# Patient Record
Sex: Female | Born: 1965
Health system: Southern US, Community
[De-identification: ages and names within clinical notes are randomized; demographics above are authoritative.]

## PROBLEM LIST (undated history)

## (undated) DIAGNOSIS — M2540 Effusion, unspecified joint: Secondary | ICD-10-CM

## (undated) DIAGNOSIS — E785 Hyperlipidemia, unspecified: Secondary | ICD-10-CM

## (undated) DIAGNOSIS — R51 Headache: Secondary | ICD-10-CM

## (undated) HISTORY — PX: APPENDECTOMY: SHX54

## (undated) HISTORY — PX: DILATION AND CURETTAGE OF UTERUS: SHX78

## (undated) HISTORY — PX: CHOLECYSTECTOMY: SHX55

## (undated) HISTORY — PX: MOUTH SURGERY: SHX715

---

## 2005-04-17 ENCOUNTER — Other Ambulatory Visit: Admission: RE | Admit: 2005-04-17 | Discharge: 2005-04-17 | Payer: Self-pay | Admitting: Internal Medicine

## 2005-05-01 ENCOUNTER — Encounter: Admission: RE | Admit: 2005-05-01 | Discharge: 2005-05-01 | Payer: Self-pay | Admitting: Internal Medicine

## 2005-08-25 ENCOUNTER — Ambulatory Visit (HOSPITAL_COMMUNITY): Admission: RE | Admit: 2005-08-25 | Discharge: 2005-08-25 | Payer: Self-pay | Admitting: Obstetrics and Gynecology

## 2005-08-25 ENCOUNTER — Encounter (INDEPENDENT_AMBULATORY_CARE_PROVIDER_SITE_OTHER): Payer: Self-pay | Admitting: *Deleted

## 2005-08-27 ENCOUNTER — Inpatient Hospital Stay (HOSPITAL_COMMUNITY): Admission: EM | Admit: 2005-08-27 | Discharge: 2005-09-01 | Payer: Self-pay | Admitting: Emergency Medicine

## 2005-08-28 ENCOUNTER — Encounter (INDEPENDENT_AMBULATORY_CARE_PROVIDER_SITE_OTHER): Payer: Self-pay | Admitting: Specialist

## 2005-09-06 ENCOUNTER — Emergency Department (HOSPITAL_COMMUNITY): Admission: EM | Admit: 2005-09-06 | Discharge: 2005-09-06 | Payer: Self-pay | Admitting: Emergency Medicine

## 2006-05-03 ENCOUNTER — Encounter: Admission: RE | Admit: 2006-05-03 | Discharge: 2006-05-03 | Payer: Self-pay | Admitting: Internal Medicine

## 2006-05-24 ENCOUNTER — Other Ambulatory Visit: Admission: RE | Admit: 2006-05-24 | Discharge: 2006-05-24 | Payer: Self-pay | Admitting: Obstetrics and Gynecology

## 2007-03-02 ENCOUNTER — Encounter: Admission: RE | Admit: 2007-03-02 | Discharge: 2007-03-02 | Payer: Self-pay | Admitting: Internal Medicine

## 2007-05-10 ENCOUNTER — Encounter: Admission: RE | Admit: 2007-05-10 | Discharge: 2007-05-10 | Payer: Self-pay | Admitting: Internal Medicine

## 2007-05-25 ENCOUNTER — Other Ambulatory Visit: Admission: RE | Admit: 2007-05-25 | Discharge: 2007-05-25 | Payer: Self-pay | Admitting: Obstetrics and Gynecology

## 2007-11-28 ENCOUNTER — Encounter: Admission: RE | Admit: 2007-11-28 | Discharge: 2007-11-28 | Payer: Self-pay | Admitting: Internal Medicine

## 2008-10-02 ENCOUNTER — Other Ambulatory Visit: Admission: RE | Admit: 2008-10-02 | Discharge: 2008-10-02 | Payer: Self-pay | Admitting: Obstetrics and Gynecology

## 2009-08-26 ENCOUNTER — Other Ambulatory Visit: Admission: RE | Admit: 2009-08-26 | Discharge: 2009-08-26 | Payer: Self-pay | Admitting: Obstetrics and Gynecology

## 2009-09-10 ENCOUNTER — Encounter: Admission: RE | Admit: 2009-09-10 | Discharge: 2009-09-10 | Payer: Self-pay | Admitting: Internal Medicine

## 2010-04-30 ENCOUNTER — Emergency Department (HOSPITAL_COMMUNITY)
Admission: EM | Admit: 2010-04-30 | Discharge: 2010-04-30 | Payer: Self-pay | Source: Home / Self Care | Admitting: Emergency Medicine

## 2010-04-30 LAB — POCT I-STAT, CHEM 8
BUN: 8 mg/dL (ref 6–23)
Creatinine, Ser: 1.1 mg/dL (ref 0.4–1.2)
Glucose, Bld: 133 mg/dL — ABNORMAL HIGH (ref 70–99)
Hemoglobin: 12.6 g/dL (ref 12.0–15.0)
Potassium: 3.8 mEq/L (ref 3.5–5.1)

## 2010-06-12 ENCOUNTER — Other Ambulatory Visit: Payer: Self-pay | Admitting: Internal Medicine

## 2010-06-12 ENCOUNTER — Ambulatory Visit
Admission: RE | Admit: 2010-06-12 | Discharge: 2010-06-12 | Disposition: A | Discharge status: Home / Self Care | Payer: 59 | Source: Ambulatory Visit | Attending: Internal Medicine | Admitting: Internal Medicine

## 2010-06-12 DIAGNOSIS — R0600 Dyspnea, unspecified: Secondary | ICD-10-CM

## 2010-06-12 DIAGNOSIS — R0781 Pleurodynia: Secondary | ICD-10-CM

## 2010-06-12 MED ORDER — IOHEXOL 300 MG/ML  SOLN
100.0000 mL | Freq: Once | INTRAMUSCULAR | Status: AC | PRN
  Administered 2010-06-12: 100 mL via INTRAVENOUS

## 2010-06-19 ENCOUNTER — Emergency Department (HOSPITAL_COMMUNITY)
Admission: EM | Admit: 2010-06-19 | Discharge: 2010-06-19 | Disposition: A | Discharge status: Home / Self Care | Payer: 59 | Attending: Emergency Medicine | Admitting: Emergency Medicine

## 2010-06-19 ENCOUNTER — Emergency Department (HOSPITAL_COMMUNITY): Payer: 59

## 2010-06-19 DIAGNOSIS — Y92009 Unspecified place in unspecified non-institutional (private) residence as the place of occurrence of the external cause: Secondary | ICD-10-CM | POA: Insufficient documentation

## 2010-06-19 DIAGNOSIS — S92919A Unspecified fracture of unspecified toe(s), initial encounter for closed fracture: Secondary | ICD-10-CM | POA: Insufficient documentation

## 2010-06-19 DIAGNOSIS — W2203XA Walked into furniture, initial encounter: Secondary | ICD-10-CM | POA: Insufficient documentation

## 2010-08-22 NOTE — Op Note (Signed)
NAME:  Melanie Lane, Melanie Lane NO.:  [id number]   MEDICAL RECORD NO.:  [id number]          PATIENT TYPE:  AMB   LOCATION:  SDC                           FACILITY:  [facility]   PHYSICIAN:  Gerald Leitz, MD          DATE OF BIRTH:  1966-03-10   DATE OF PROCEDURE:  08/25/2005  DATE OF DISCHARGE:                                 OPERATIVE REPORT   PREOPERATIVE DIAGNOSIS:  Missed abortion.   POSTOPERATIVE DIAGNOSIS:  Missed abortion.   OPERATION PERFORMED:  Dilation and suction curettage.   SURGEON:  Gerald Leitz, MD   ASSISTANT:  None.   ANESTHESIA:  MAC.   SPECIMENS:  Products of conception.   ESTIMATED BLOOD LOSS:  Minimal.   COMPLICATIONS:  None.   INDICATIONS FOR PROCEDURE:  This is a 45 year old G3, P1-0-1-1 at 8 weeks  estimated gestational age by last menstrual period.  She had an ultrasound  in our office today that showed a 6 week, 6 day intrauterine pregnancy with  no cardiac activity.  Risks, benefits and alternatives of surgery were  discussed with the patient.   DESCRIPTION OF PROCEDURE:  The patient was taken to the operating room and  she was placed under MAC anesthesia.  She was prepped and draped in the  usual sterile fashion.  Her bladder was drained with in and out Foley  catheter.  Bivalve speculum was placed into the vaginal vault.  The anterior  lip of the cervix was grasped with a single toothed tenaculum.  A  paracervical block was achieved with approximately 18 mL of 0.25% Marcaine  without epinephrine.  The cervix was dilated, sounded to approximately 8 cm.  A #7 suction curettage was introduced into the cervical os and up to the  fundus.  Products of conception were removed under suction.  This was  repeated until all products were removed. Sharp curettage was performed  until gritty texture was noted all way around.  Instruments were removed  from the patient's vagina.  The patient was noted to have bleeding from her  tenaculum site.  Hemostasis  was attempted with silver nitrate.  This was not  successful, so an interrupted stitch of 3-  0 Vicryl was placed on the anterior cervix at the site of the tenaculum.  Excellent hemostasis was noted.  The speculum was removed from the vagina.  Sponge, lap, and needle counts were correct times two.  She was then taken  to recovery room awake and in stable condition.      Gerald Leitz, MD  Electronically Signed     TC/MEDQ  D:  08/25/2005  T:  08/26/2005  Job:  [id number]

## 2010-08-22 NOTE — H&P (Signed)
NAMEXYLA, LEISNER NO.:  [id number]   MEDICAL RECORD NO.:  [id number]          PATIENT TYPE:  INP   LOCATION:  1512                         FACILITY:  [facility]   PHYSICIAN:  Anselm Pancoast. Weatherly, M.D.DATE OF BIRTH:  [birth year]   DATE OF ADMISSION:  08/27/2005  DATE OF DISCHARGE:                                HISTORY & PHYSICAL   CHIEF COMPLAINT:  Abdominal pain.   HISTORY OF PRESENT ILLNESS:  [name] is a 45 year old female who  presented to the emergency room on Aug 27, 2005, with the following history:  The patient was about nine weeks pregnant and had a miscarriage.  Then was  treated at [facility] on Tuesday where she had a suction and  evacuation of her uterus.  She did fine and was released, and then  approximately 24 hours later started having abdominal pain which she  originally described as kind of epigastric.  She felt as if her bowels did  not want to work.  She then came here to the emergency room at [facility] at about 4:30 a.m. on Aug 27, 2005.  Her gynecologist, Dr. Marchia Bond, was  contacted.  Dr. Devoria Albe had seen her in the emergency room, and on  examination she was afebrile.  She was kind of vaguely tender throughout her  abdomen.  She originally had an elevated pulse, but then in the emergency  room for a short while the pulse was 80.  Laboratory studies showed a white  count I think of 10,600.  There was a left shift.  The electrolytes were  normal.  A urinalysis was unremarkable.  She had originally been treated for  a urinary tract infection several days ago, and Dr. Marchia Bond after talking with  the emergency room physician, a CT was performed.  This was interpreted as  normal with the exception that she has a large uterus consistent with the  recent pregnancy, but no fluid, no free air, etc.  But because of the  tenderness, I was asked to see the patient at approximately 10:30 p.m.   PHYSICAL EXAMINATION:  GENERAL:  She  was definitely vaguely tender  throughout her abdomen, not localized to any area of the abdomen.  The  patient stated that she was hungry and was requesting food.  VITAL SIGNS:  A temperature recheck was 100.4 degrees.  ABDOMEN:  She did have a few bowel sounds, decreased activity, but not  absent.  I could definitely find no localized tenderness in any quadrant of  the abdomen.   I thought after discussing this with the radiologist, thought that it was  unlikely that she was actually bleeding, if we could not see any evidence of  fluid.  Dr. Marchia Bond said that there was no evidence of any perforation of her  uterus or significant bleeding afterward following the procedure.  I thought  it would be best just to re-examine her in approximately six hours and  repeat her CBC, and that is what we did.  The repeat white count which was  done at 4 a.m.  had not changed; however, she did have a temperature of 102  degrees at 4 a.m.  I saw her approximately an hour later.  The white count  was 10,300.   On examination now she is definitely more tender in the right lower quadrant  where previously it was kind of vaguely tender in all areas; sort of like  this could possibly be an appendicitis.  I think that with a negative CT  last evening and I did a flat and upright abdominal film, she is gaseous  where she was not yesterday, and an exploratory laparotomy is indicated.  I  am going to start her on Unasyn.  She has been on Rocephin.  Permission is  obtained for a laparotomy.  Dr. Marchia Bond will come in and desires to be here.  Whether this is related to the [facility] and the miscarriage, I certainly cannot  tell.  On the acute and flat and upright abdominal films this morning, there  was no evidence of any free air on either of the views.   ALLERGIES:  She says she has no known drug allergies.   Her eyes, ears, nose and throat, etc are negative.   This was the first pregnancy with this husband.  She is  remarried and I  think all total has four children, but it appears that they are from  previously.  When she was admitted previously, we really had no definite  explanation of what the abdominal pain was doing.  She has had a previous  cholecystectomy I think in 1999.           ______________________________  Anselm Pancoast. Zachery Dakins, M.D.     WJW/MEDQ  D:  08/28/2005  T:  08/28/2005  Job:  [id number]

## 2010-08-22 NOTE — Discharge Summary (Signed)
[name], [name] NO.:  [id number]   MEDICAL RECORD NO.:  [id number]          PATIENT TYPE:  INP   LOCATION:  1512                         FACILITY:  [facility]   PHYSICIAN:  Anselm Pancoast. Weatherly, M.D.DATE OF BIRTH:  Sep 08, 1965   DATE OF ADMISSION:  08/27/2005  DATE OF DISCHARGE:  09/01/2005                                 DISCHARGE SUMMARY   DISCHARGE DIAGNOSES:  1.  Acute appendicitis with progressive early peritonitis.  2.  Recent dilatation and curettage following a miscarriage.   HISTORY:  Melanie Lane is a 44 year old female who gives the following  history.  She came to the emergency room on the 24th.  She was about [redacted] weeks  pregnant and had a miscarriage that was treated at [facility].  This  was approximately I think 4 days earlier with suction and evacuation of her  uterus by Dr. Richardson Dopp.  She was released approximately 24 hours later and then  was told that she had a urinary tract infection.  Then she started having  epigastric pain that progressed to a generalized abdominal pain and  contacted her gynecologist and then was advised come to the emergency room  where she was seen by Dr. Lynelle Doctor.  Dr. Lynelle Doctor examined her and she was  afebrile but was vaguely tender throughout her abdomen.  Originally had a  significant elevated pulse and had a white count 10,600.  Dr. Lynelle Doctor  contacted Dr. Richardson Dopp.  Dr. Richardson Dopp actually saw the patient and arranged for her  admission.  A CT was performed which did not show any definite abnormalities  and a general surgical consultation was requested by Dr. Richardson Dopp since the  question was whether or not she had developed a peritonitis, perforated  uterus or just what.  I saw her in the emergency room on the 24th and it was  about nearly midnight at that time and I was impressed that she was  definitely vaguely tender throughout the abdomen, more so on the left.  Cultures had been done of her uterus and etc. and the question was  whether  she needed to be explored that night but she said she was hungry and I  thought that it would be best to just reexamine in several hours.  I did  early the next morning and this was probably 4 or 5 a.m. and she was  definitely having progressive tenderness with fever and a more impressive  elevated white count.  We added her to the OR schedule for early the next  morning.   She was taken to surgery.  Dr. Richardson Dopp assisted and on opening through a lower  midline incision, there was definitely a little bit of fluid in the pelvis.  Her appendix, which had not been enlarged on CT, was not enlarged and looked  essentially normal.  The fluid was more in the lower abdomen then in the  upper abdomen and no explanation for this peritonitis was identified.  The  incision was made a little larger.  Dr. Richardson Dopp examined the uterus and we  certainly could find no evidence  of any perforation or obvious inflammation  of the tubes and ovaries both looked normal.  At that time, I was impressed  that the appendix certainly felt more inflamed and just felt firmer.  I did  an appendectomy and sent it for frozen examine and Dr. Luisa Hart was the  pathologist.  It is now 8 o'clock or so and the appendix was consistent with  appendicitis.  Dr. Daphine Deutscher actually scrubbed in at surgery and Dr. Richardson Dopp had  an airplane flight to take and explored it.  He felt around, looked around  and thought there was no other pathology noted and that even though this was  certainly not a straight forward appendicitis, with the kind of combination  of symptoms, thought it to be best to do no more surgery and continue on  antibiotics and observation.   Postoperatively, she felt significantly better.  She was able to be started  on liquids in approximately 24 hours and her diet was advanced.  She had a  midline incision from when she had a major laparotomy and her incisions  appeared to be healing nicely.  Her diet was advanced and  she was ready for  discharge I think on the fourth or fifth postoperative day.  Her staples  were removed and wound was Steri-Stripped, Vicodin for pain and she will  follow up with me in approximately one week and has an appointment with Dr.  Richardson Dopp in approximately two weeks.   DISCHARGE DIAGNOSIS:  Acute appendicitis with progressive earlier  peritonitis in a kind of immediate postoperative period following a  miscarriage which was managed with dilatation and evacuation.           ______________________________  Anselm Pancoast. Zachery Dakins, M.D.     WJW/MEDQ  D:  09/23/2005  T:  09/23/2005  Job:  [id number]   cc:   Gerald Leitz, MD

## 2010-08-22 NOTE — Op Note (Signed)
NAMESHIESHA, JAHN NO.:  [id number]   MEDICAL RECORD NO.:  [id number]           PATIENT TYPE:   LOCATION:                                 FACILITY:   PHYSICIAN:  Anselm Pancoast. Zachery Dakins, M.D.  DATE OF BIRTH:   DATE OF PROCEDURE:  08/28/2005  DATE OF DISCHARGE:                                 OPERATIVE REPORT   PREOPERATIVE DIAGNOSIS:  Progressive peritonitis etiology is not known.  She  is 2 days status post dilation and curettage evacuation for a miscarriage,  approximately 8 weeks duration, had a negative CT last evening.   POSTOP DIAGNOSIS:  Acute appendicitis with peritonitis recent dilation and  curettage.   OPERATION:  Exploratory laparotomy, general anesthesia, lower midline  incision surgery.   SURGEON:  Anselm Pancoast. Zachery Dakins, M.D.   ASSISTANT:  Dr. Gerald Leitz.   HISTORY:  Melanie Lane is a 45 year old female who had a miscarriage at  approximately 9 weeks, time interval, earlier this week.  She had a D&C  evacuation by Dr. Richardson Dopp on Tuesday at [facility], appeared to be doing  fine, and was released; and felt good for about 24 hours when she started  having epigastric pain, felt like her bowels did not want to work; and then  had more pain, yesterday.  She came to the emergency room and was seen by  Dr. Lynelle Doctor; and Dr. Richardson Dopp was consulted.   On physical exam she was definitely tender, kind of diffuse throughout the  abdomen, but not marked tenderness.  Originally she had an elevated pulse,  but in the emergency room her pulse was 80; and I was asked to see her,  after she had a negative CAT scan at approximately 10:30 p.m.Marland Kitchen  She was kind  of vaguely tender, but no real marked tenderness in any quadrant of the  abdomen; and desired to eat.  I recommended that we re-examine her in  approximately 6 hours.  She had already been started on antibiotics,  thinking that this was somehow or another related to an endometritis, or  related to the [facility], or  what.  I did repeat a white count this morning.  The  white count was not significantly changed, it was 10,300.  This was still  10,200; but she had a fever of 102 at approximately 4 a.m.; and on physical  exam, her exam is definitely tender with much more marked tenderness in the  right lower quadrant, where it was not localized last evening.  I  recommended that we go ahead and proceed with exploratory laparotomy.  I did  do a flat and upright abdominal film this morning, no free air, but she is  much more gaseous than she was on yesterday's x-ray examination.  Preoperatively, I gave her 3 gm of Unasyn and I plan to explore through a  low midline incision.   The patient is positioned on OR table.  She has PAS stockings.  Induction of  general anesthesia, endotracheal tube, NG placed into the stomach.  Foley  catheter was inserted, sterilely, and we did prep the  vagina and Dr. Richardson Dopp  was present.  She had talked with the patient that if this was a perforated  uterus or other problems of infection related to the uterus, permission was  obtained for a hysterectomy.  A small lower midline incision was made, and  through the adipose tissue, the patient was a little bit on the slightly  overweight side, I then very carefully opened through the fascia through the  posterior rectus fascia; and then taken it down to the peritoneum; and  opened it to the peritoneal cavity.   There was definitely turbid fluid in the right lower quadrant.  The cecum  was quite dilated, and you could see the appendix which grossly appeared to  be normal.  As far as we extended the incision, somewhat, because of the  amount of fluid; and then very carefully inspected the uterus.  Neither the  right or left tubes appeared to be an active salpingitis; there was no  evidence of any perforation of the uterus.  The uterus was enlarged  consistent with her 8-9 week pregnancy and the distal ileum could be seen.  I extended  the incision, up to the umbilicus because I was concerned with  the amount of fluid that we had originally noted; then ran the small bowel.  There was definitely a little bit of exudate on it, more in the right lower  quadrant.   Inspection of the upper abdomen--we did not see any significant fluid on  either the right or left.  She has had a previous cholecystectomy.  I could  feel the duodenum; could not feel an ulcer or see and succuss __________ up  in the upper abdomen.  The appendix was a little thickened, not acutely  inflamed like a perforation, but a little thicker than I thought would be  normal, even though at first we had thought that the appendix was normal;  and I went ahead and did the appendectomy.  The mesentery tied with 2-0  Vicryl.  The appendix crushed and pursestring suture of 2-0 silk with stump  inverted, and the pursestring suture tied.  The appendix was sent for frozen  examination and Dr. Luisa Hart examined it, he said this is definitely acute  appendicitis, no perforation, but it was definitely acute appendicitis.  Even though the appendix, itself, was not all that impressive; but it was  thickened, and somewhat hyperemic as at first we were thinking that it was  __________ in size.   The cultures that we had done, aerobic and anaerobic and a STAT gram stain,  showed a lot of polymorphonuclear leukocytes, but there was no bacteria  noted on the gram stain of the peritoneal fluid.  Dr. Richardson Dopp was planning to  leave out of town, later this morning, and had a plane to catch, so she  scrubbed out; and Dr. Daphine Deutscher scrubbed in; and I had him to basically re-  examine everything as far as cecum, small bowel, upper abdomen, etcetera to  make sure that nothing had been missed.  He was in agreement that no other  pathology could be identified, and even though this is a very atypical  presentation we feel that it was marked appendicitis causing the inflammation, and possibly  some of the fluid may be related to the previous  D&C.   With this, sponge count was correct.  The small bowel was back in its normal  position.  The NG-tube was in good position.  We went ahead  and close the  abdomen.  We used an #0 Vicryl in the posterior rectus fascia and  peritoneum; and #0 Prolenes on the anterior and above the umbilicus, which I  had extended up slightly above the umbilicus.  The skin was closed with  staples; and the patient tolerated the procedure, extubated, and taken to  the recovery room in stable postop condition.  I am going to keep her on  antibiotics for approximately 48 hours, await the results of the cultures;  and family informed of our findings.  I am going to use PCA morphine for  pain control.  I will allow ice chips but keep the NG-tube because of the  fairly marked exploration, looking for the source of what we first thought  was a perforation.  On examination of her left colon, there was no evidence  of any diverticulitis or acute inflammation of the colon; and of course,  none has been seen on the CT even last evening.  Sponge and needle counts  were correct.  Estimated blood loss was minimal.           ______________________________  Anselm Pancoast. Zachery Dakins, M.D.     WJW/MEDQ  D:  08/28/2005  T:  08/28/2005  Job:  [id number]   cc:   Gerald Leitz, MD   [place] Daphine Deutscher, MD  [address], [address]  [place]  [place] [address]

## 2010-10-02 ENCOUNTER — Other Ambulatory Visit: Payer: Self-pay | Admitting: Obstetrics and Gynecology

## 2010-10-28 ENCOUNTER — Other Ambulatory Visit (HOSPITAL_COMMUNITY)
Admission: RE | Admit: 2010-10-28 | Discharge: 2010-10-28 | Disposition: A | Discharge status: Home / Self Care | Payer: 59 | Source: Ambulatory Visit | Attending: Obstetrics and Gynecology | Admitting: Obstetrics and Gynecology

## 2010-10-28 ENCOUNTER — Other Ambulatory Visit: Payer: Self-pay | Admitting: Obstetrics and Gynecology

## 2010-10-28 DIAGNOSIS — Z01419 Encounter for gynecological examination (general) (routine) without abnormal findings: Secondary | ICD-10-CM | POA: Insufficient documentation

## 2011-05-29 ENCOUNTER — Other Ambulatory Visit: Payer: Self-pay | Admitting: Internal Medicine

## 2011-05-29 DIAGNOSIS — Z1231 Encounter for screening mammogram for malignant neoplasm of breast: Secondary | ICD-10-CM

## 2011-06-15 ENCOUNTER — Ambulatory Visit
Admission: RE | Admit: 2011-06-15 | Discharge: 2011-06-15 | Disposition: A | Discharge status: Home / Self Care | Payer: 59 | Source: Ambulatory Visit | Attending: Internal Medicine | Admitting: Internal Medicine

## 2011-06-15 DIAGNOSIS — Z1231 Encounter for screening mammogram for malignant neoplasm of breast: Secondary | ICD-10-CM

## 2011-06-27 ENCOUNTER — Encounter (HOSPITAL_COMMUNITY): Payer: Self-pay

## 2011-06-27 ENCOUNTER — Other Ambulatory Visit: Payer: Self-pay

## 2011-06-27 ENCOUNTER — Emergency Department (HOSPITAL_COMMUNITY)
Admission: EM | Admit: 2011-06-27 | Discharge: 2011-06-28 | Disposition: A | Discharge status: Home / Self Care | Payer: 59 | Attending: Emergency Medicine | Admitting: Emergency Medicine

## 2011-06-27 ENCOUNTER — Emergency Department (HOSPITAL_COMMUNITY): Payer: 59

## 2011-06-27 DIAGNOSIS — R1013 Epigastric pain: Secondary | ICD-10-CM | POA: Insufficient documentation

## 2011-06-27 DIAGNOSIS — E119 Type 2 diabetes mellitus without complications: Secondary | ICD-10-CM | POA: Insufficient documentation

## 2011-06-27 DIAGNOSIS — R5381 Other malaise: Secondary | ICD-10-CM | POA: Insufficient documentation

## 2011-06-27 DIAGNOSIS — R1011 Right upper quadrant pain: Secondary | ICD-10-CM | POA: Insufficient documentation

## 2011-06-27 DIAGNOSIS — N898 Other specified noninflammatory disorders of vagina: Secondary | ICD-10-CM | POA: Insufficient documentation

## 2011-06-27 DIAGNOSIS — R0789 Other chest pain: Secondary | ICD-10-CM | POA: Insufficient documentation

## 2011-06-27 LAB — GLUCOSE, CAPILLARY: Glucose-Capillary: 114 mg/dL — ABNORMAL HIGH (ref 70–99)

## 2011-06-27 LAB — POCT I-STAT TROPONIN I: Troponin i, poc: 0 ng/mL (ref 0.00–0.08)

## 2011-06-27 LAB — POCT I-STAT, CHEM 8
Creatinine, Ser: 0.9 mg/dL (ref 0.50–1.10)
HCT: 37 % (ref 36.0–46.0)
Hemoglobin: 12.6 g/dL (ref 12.0–15.0)
Potassium: 3.7 mEq/L (ref 3.5–5.1)
Sodium: 142 mEq/L (ref 135–145)
TCO2: 26 mmol/L (ref 0–100)

## 2011-06-27 NOTE — ED Provider Notes (Signed)
History     CSN: [id number]  Arrival date & time 06/27/11  1932   First MD Initiated Contact with Patient 06/27/11 2132      Chief Complaint  Patient presents with  . Chest Pain  . Fatigue    (Consider location/radiation/quality/duration/timing/severity/associated sxs/prior treatment) HPI Comments: [name] reports that on Wednesday while standing making the bed.  She had sharp, stabbing, chest pain in the epigastric area that lasted for several seconds.  She repeated this pain on Thursday and Friday.  Today she had another episode that lasted longer.  This pain has been radiating up under her right shoulder.  She was recently diagnosed with diabetes.  She has started eating better.  Has lost 7 pounds.  She also has a 6 cm ovarian cyst and is due for surgical removal in 2 weeks.  Her menses was 10 days late.  She saw her OB/GYN on Thursday to verify that she was not pregnant.  She has had heavy bleeding for this current cycle.  No history of heavy bleeding in the past.  No history of anemia  Patient is a 46 y.o. female presenting with chest pain. The history is provided by the patient.  Chest Pain The chest pain began 3 - 5 days ago. Chest pain occurs intermittently. The chest pain is resolved. The pain is associated with exertion. At its most intense, the pain is at 4/10. The pain is currently at 0/10. The severity of the pain is moderate. The quality of the pain is described as aching. The pain radiates to the upper back. Chest pain is worsened by eating. Primary symptoms include abdominal pain. Pertinent negatives for primary symptoms include no fever, no shortness of breath, no cough, no nausea, no vomiting and no dizziness.  Pertinent negatives for associated symptoms include no weakness. She tried nothing for the symptoms.     Past Medical History  Diagnosis Date  . Diabetes mellitus     Past Surgical History  Procedure Date  . Cholecystectomy   . Appendectomy     No  family history on file.  History  Substance Use Topics  . Smoking status: Never Smoker   . Smokeless tobacco: Not on file  . Alcohol Use: No    OB History    Grav Para Term Preterm Abortions TAB SAB Ect Mult Living                  Review of Systems  Constitutional: Negative for fever and chills.  Respiratory: Negative for cough and shortness of breath.   Cardiovascular: Positive for chest pain. Negative for leg swelling.  Gastrointestinal: Positive for abdominal pain. Negative for nausea, vomiting, diarrhea and constipation.  Genitourinary: Positive for vaginal bleeding. Negative for dysuria.  Musculoskeletal: Positive for back pain.  Skin: Negative for pallor and rash.  Neurological: Negative for dizziness and weakness.    Allergies  Codeine  Home Medications   Current Outpatient Rx  Name Route Sig Dispense Refill  . ACETAMINOPHEN 500 MG PO TABS Oral Take 1,000 mg by mouth every 6 (six) hours as needed. For pain      BP 125/90  Pulse 76  Temp(Src) 98.9 F (37.2 C) (Oral)  Resp 18  Ht 5\' 1"  (1.549 m)  Wt 166 lb (75.297 kg)  BMI 31.37 kg/m2  SpO2 98%  LMP 06/24/2011  Physical Exam  Constitutional: She appears well-developed and well-nourished.  HENT:  Head: Normocephalic.  Neck: Normal range of motion.  Cardiovascular: Normal rate.  Pulmonary/Chest: Effort normal.  Abdominal: She exhibits no distension. There is no hepatosplenomegaly. There is tenderness in the right upper quadrant and epigastric area. There is tenderness at McBurney's point. There is no rebound and no guarding. No hernia. Hernia confirmed negative in the ventral area.    Musculoskeletal: Normal range of motion.  Neurological: She is alert.  Skin: Skin is warm and dry.  Psychiatric: She has a normal mood and affect.    ED Course  Procedures (including critical care time)  Labs Reviewed  GLUCOSE, CAPILLARY - Abnormal; Notable for the following:    Glucose-Capillary 114 (*)    All  other components within normal limits  POCT I-STAT, CHEM 8 - Abnormal; Notable for the following:    Glucose, Bld 124 (*)    All other components within normal limits  HEPATIC FUNCTION PANEL - Abnormal; Notable for the following:    Total Bilirubin 0.2 (*)    All other components within normal limits  POCT I-STAT TROPONIN I  LIPASE, BLOOD  CBC  DIFFERENTIAL   Dg Chest Port 1 View  06/27/2011  *RADIOLOGY REPORT*  Clinical Data: Chest pain.  CHEST - 1 VIEW  Comparison:  None.  Findings: The heart size and mediastinal contours are within normal limits.  Both lungs are clear.  IMPRESSION: No active disease.  Original Report Authenticated By: Danae Orleans, M.D.     1. Chest pain, atypical       MDM  Epigastric and right upper quadrant pain, although this patient has had a cholecystectomy, suspicious for a stone in the duct.  Will check LFTs        Arman Filter, NP 06/28/11 [phone]

## 2011-06-27 NOTE — ED Notes (Signed)
Pt presents with chest pain and fatigue- EKG performed upon arrival shown EDP Cook- NSR

## 2011-06-28 LAB — DIFFERENTIAL
Basophils Relative: 0 % (ref 0–1)
Eosinophils Absolute: 0.2 10*3/uL (ref 0.0–0.7)
Lymphs Abs: 2.4 10*3/uL (ref 0.7–4.0)
Neutro Abs: 3.8 10*3/uL (ref 1.7–7.7)
Neutrophils Relative %: 55 % (ref 43–77)

## 2011-06-28 LAB — HEPATIC FUNCTION PANEL
ALT: 8 U/L (ref 0–35)
Bilirubin, Direct: <0.1 mg/dL (ref 0.0–0.3)
Total Protein: 7.1 g/dL (ref 6.0–8.3)

## 2011-06-28 LAB — CBC
MCH: 28.4 pg (ref 26.0–34.0)
Platelets: 209 10*3/uL (ref 150–400)
RBC: 3.8 MIL/uL — ABNORMAL LOW (ref 3.87–5.11)

## 2011-06-28 LAB — LIPASE, BLOOD: Lipase: 27 U/L (ref 11–59)

## 2011-06-28 NOTE — Discharge Instructions (Signed)
Chest Pain (Nonspecific) Chest pain has many causes. Your pain could be caused by something serious, such as a heart attack or a blood clot in the lungs. It could also be caused by something less serious, such as a chest bruise or a virus. Follow up with your doctor. More lab tests or other studies may be needed to find the cause of your pain. Most of the time, nonspecific chest pain will improve within 2 to 3 days of rest and mild pain medicine. HOME CARE  For chest bruises, you may put ice on the sore area for 15 to 20 minutes, 3 to 4 times a day. Do this only if it makes you or your child feel better.   Put ice in a plastic bag.   Place a towel between the skin and the bag.   Rest for the next 2 to 3 days.   Go back to work if the pain improves.   See your doctor if the pain lasts longer than 1 to 2 weeks.   Only take medicine as told by your doctor.   Quit smoking if you smoke.  GET HELP RIGHT AWAY IF:   There is more pain or pain that spreads to the arm, neck, jaw, back, or belly (abdomen).   You or your child has shortness of breath.   You or your child coughs more than usual or coughs up blood.   You or your child has very bad back or belly pain, feels sick to his or her stomach (nauseous), or throws up (vomits).   You or your child has very bad weakness.   You or your child passes out (faints).   You or your child has a temperature by mouth above 102 F (38.9 C), not controlled by medicine.  Any of these problems may be serious and may be an emergency. Do not wait to see if the problems will go away. Get medical help right away. Call your local emergency services 911 in [place] Do not drive yourself to the hospital. MAKE SURE YOU:   Understand these instructions.   Will watch this condition.   Will get help right away if you or your child is not doing well or gets worse.  Document Released: 09/09/2007 Document Revised: 03/12/2011 Document Reviewed:  09/09/2007 [facility] Patient Information 2012 [place], [place]. Tonight.  She chest x-ray, EKG, cardiac markers, and lab work were all well within normal limits.  The exact cause for your chest discomfort is indeterminate.  This time.  Please make appointment with your physician for followup next week if your chest pain becomes worse,your developed shortness of breath, nausea, vomiting, become sweaty please return for further evaluation

## 2011-06-28 NOTE — ED Provider Notes (Signed)
Medical screening examination/treatment/procedure(s) were conducted as a shared visit with non-physician practitioner(s) and myself.  I personally evaluated the patient during the encounter  Fleeting sharp stabbing chest pain for a few seconds at a time, 3 times in past 3 days.   EKG nonischemic.  Atypical for ACS.  Glynn Octave, MD 06/28/11 [phone]

## 2011-06-29 ENCOUNTER — Encounter (HOSPITAL_COMMUNITY): Payer: Self-pay | Admitting: Pharmacist

## 2011-07-06 ENCOUNTER — Encounter (HOSPITAL_COMMUNITY): Payer: Self-pay

## 2011-07-06 ENCOUNTER — Encounter (HOSPITAL_COMMUNITY)
Admission: RE | Admit: 2011-07-06 | Discharge: 2011-07-06 | Disposition: A | Discharge status: Home / Self Care | Payer: 59 | Source: Ambulatory Visit | Attending: Obstetrics and Gynecology | Admitting: Obstetrics and Gynecology

## 2011-07-06 DIAGNOSIS — Z01818 Encounter for other preprocedural examination: Secondary | ICD-10-CM | POA: Insufficient documentation

## 2011-07-06 DIAGNOSIS — Z01812 Encounter for preprocedural laboratory examination: Secondary | ICD-10-CM | POA: Insufficient documentation

## 2011-07-06 HISTORY — DX: Effusion, unspecified joint: M25.40

## 2011-07-06 HISTORY — DX: Headache: R51

## 2011-07-06 LAB — SURGICAL PCR SCREEN
MRSA, PCR: NEGATIVE
Staphylococcus aureus: NEGATIVE

## 2011-07-06 NOTE — Patient Instructions (Addendum)
   Your procedure is scheduled ZO:[id number] April 11th  Enter through the Main Entrance of [facility] at: 10:15am Pick up the phone at the desk and dial [phone] and inform us of your arrival.  Please call this number if you have any problems the morning of surgery: [phone]  Remember: Do not eat food after midnight: Wednesday Do not drink clear liquids after: 9am Thursday Take these medicines the morning of surgery with a SIP OF WATER: none  Do not wear jewelry, make-up, or FINGER nail polish Do not wear lotions, powders, perfumes or deodorant. Do not shave 48 hours prior to surgery. Do not bring valuables to the hospital. Contacts, dentures or bridgework may not be worn into surgery.  Leave suitcase in the car. After Surgery it may be brought to your room. For patients being admitted to the hospital, checkout time is 11:00am the day of discharge.    Remember to use your hibiclens as instructed.Please shower with 1/2 bottle the evening before your surgery and the other 1/2 bottle the morning of surgery. Neck down avoiding private area.

## 2011-07-06 NOTE — Pre-Procedure Instructions (Addendum)
Pt was seen in [facility] ER 06/28/11 with c/o chest pain- Medical screening examination/treatment/procedure(s) were conducted as a shared visit with non-physician practitioner(s) and myself. I personally evaluated the patient during the encounter  Fleeting sharp stabbing chest pain for a few seconds at a time, 3 times in past 3 days.  EKG nonischemic. Atypical for ACS.  Glynn Octave, MD  06/28/11 [phone]   Pt states was recommended to have stress test-had planned on scheduling after surgery-Dr Sherron Ales called and notified of history-wishes patient to have stress test prior to surgical date. Pt understands and is calling to schedule stress test with her pcp-Dr Valentina Lucks. Dr Bing Ree office notified.-[facility] aware pt needs stress test prior to surgery

## 2011-07-15 ENCOUNTER — Other Ambulatory Visit: Payer: Self-pay | Admitting: Obstetrics and Gynecology

## 2011-07-16 ENCOUNTER — Inpatient Hospital Stay (HOSPITAL_COMMUNITY)
Admission: RE | Admit: 2011-07-16 | Discharge: 2011-07-18 | DRG: 743 | Disposition: A | Discharge status: Home / Self Care | Payer: 59 | Source: Ambulatory Visit | Attending: Obstetrics and Gynecology | Admitting: Obstetrics and Gynecology

## 2011-07-16 ENCOUNTER — Encounter (HOSPITAL_COMMUNITY): Payer: Self-pay | Admitting: Anesthesiology

## 2011-07-16 ENCOUNTER — Inpatient Hospital Stay (HOSPITAL_COMMUNITY): Payer: 59 | Admitting: Anesthesiology

## 2011-07-16 ENCOUNTER — Encounter (HOSPITAL_COMMUNITY)
Admission: RE | Disposition: A | Discharge status: Home / Self Care | Payer: Self-pay | Source: Ambulatory Visit | Attending: Obstetrics and Gynecology

## 2011-07-16 DIAGNOSIS — N83209 Unspecified ovarian cyst, unspecified side: Secondary | ICD-10-CM | POA: Diagnosis present

## 2011-07-16 DIAGNOSIS — D251 Intramural leiomyoma of uterus: Secondary | ICD-10-CM | POA: Diagnosis present

## 2011-07-16 DIAGNOSIS — N949 Unspecified condition associated with female genital organs and menstrual cycle: Secondary | ICD-10-CM | POA: Diagnosis present

## 2011-07-16 DIAGNOSIS — Z01812 Encounter for preprocedural laboratory examination: Secondary | ICD-10-CM

## 2011-07-16 DIAGNOSIS — N8300 Follicular cyst of ovary, unspecified side: Secondary | ICD-10-CM | POA: Diagnosis present

## 2011-07-16 DIAGNOSIS — D279 Benign neoplasm of unspecified ovary: Principal | ICD-10-CM | POA: Diagnosis present

## 2011-07-16 DIAGNOSIS — N831 Corpus luteum cyst of ovary, unspecified side: Secondary | ICD-10-CM | POA: Diagnosis present

## 2011-07-16 DIAGNOSIS — Z01818 Encounter for other preprocedural examination: Secondary | ICD-10-CM

## 2011-07-16 DIAGNOSIS — Z90710 Acquired absence of both cervix and uterus: Secondary | ICD-10-CM

## 2011-07-16 HISTORY — PX: ABDOMINAL HYSTERECTOMY: SHX81

## 2011-07-16 LAB — CBC
Hemoglobin: 10.4 g/dL — ABNORMAL LOW (ref 12.0–15.0)
Platelets: 186 10*3/uL (ref 150–400)
RBC: 3.67 MIL/uL — ABNORMAL LOW (ref 3.87–5.11)
WBC: 6.1 10*3/uL (ref 4.0–10.5)

## 2011-07-16 LAB — BASIC METABOLIC PANEL
Calcium: 8.8 mg/dL (ref 8.4–10.5)
Chloride: 105 mEq/L (ref 96–112)
Creatinine, Ser: 0.74 mg/dL (ref 0.50–1.10)
GFR calc Af Amer: >90 mL/min (ref >90)
Sodium: 137 mEq/L (ref 135–145)

## 2011-07-16 LAB — GLUCOSE, CAPILLARY: Glucose-Capillary: 97 mg/dL (ref 70–99)

## 2011-07-16 LAB — TYPE AND SCREEN: ABO/RH(D): A POS

## 2011-07-16 LAB — ABO/RH: ABO/RH(D): A POS

## 2011-07-16 SURGERY — HYSTERECTOMY, ABDOMINAL
Anesthesia: General | Site: Abdomen | Wound class: Clean Contaminated

## 2011-07-16 MED ORDER — MICROFIBRILLAR COLL HEMOSTAT EX PADS
MEDICATED_PAD | CUTANEOUS | Status: DC | PRN
  Administered 2011-07-16: 1 via TOPICAL

## 2011-07-16 MED ORDER — PROPOFOL 10 MG/ML IV EMUL
INTRAVENOUS | Status: DC | PRN
  Administered 2011-07-16: 200 mg via INTRAVENOUS

## 2011-07-16 MED ORDER — GLYCOPYRROLATE 0.2 MG/ML IJ SOLN
INTRAMUSCULAR | Status: AC
  Filled 2011-07-16: qty 2

## 2011-07-16 MED ORDER — MICROFIBRILLAR COLL HEMOSTAT EX POWD
CUTANEOUS | Status: AC
  Filled 2011-07-16: qty 5

## 2011-07-16 MED ORDER — ONDANSETRON HCL 4 MG/2ML IJ SOLN
INTRAMUSCULAR | Status: DC | PRN
  Administered 2011-07-16: 4 mg via INTRAVENOUS

## 2011-07-16 MED ORDER — NALOXONE HCL 0.4 MG/ML IJ SOLN: 0.4000 mg | INTRAMUSCULAR | Status: DC | PRN

## 2011-07-16 MED ORDER — ONDANSETRON HCL 4 MG/2ML IJ SOLN: 4.0000 mg | Freq: Four times a day (QID) | INTRAMUSCULAR | Status: DC | PRN

## 2011-07-16 MED ORDER — GLYCOPYRROLATE 0.2 MG/ML IJ SOLN
INTRAMUSCULAR | Status: DC | PRN
  Administered 2011-07-16: .8 mg via INTRAVENOUS

## 2011-07-16 MED ORDER — ONDANSETRON HCL 4 MG/2ML IJ SOLN
INTRAMUSCULAR | Status: AC
  Filled 2011-07-16: qty 2

## 2011-07-16 MED ORDER — NEOSTIGMINE METHYLSULFATE 1 MG/ML IJ SOLN
INTRAMUSCULAR | Status: DC | PRN
  Administered 2011-07-16: 4 mg via INTRAVENOUS

## 2011-07-16 MED ORDER — FENTANYL CITRATE 0.05 MG/ML IJ SOLN
INTRAMUSCULAR | Status: AC
  Filled 2011-07-16: qty 2

## 2011-07-16 MED ORDER — ROCURONIUM BROMIDE 50 MG/5ML IV SOLN
INTRAVENOUS | Status: AC
  Filled 2011-07-16: qty 1

## 2011-07-16 MED ORDER — KETOROLAC TROMETHAMINE 30 MG/ML IJ SOLN
30.0000 mg | Freq: Three times a day (TID) | INTRAMUSCULAR | Status: AC
  Administered 2011-07-16 – 2011-07-17 (×2): 30 mg via INTRAVENOUS
  Filled 2011-07-16 (×2): qty 1

## 2011-07-16 MED ORDER — MIDAZOLAM HCL 5 MG/5ML IJ SOLN
INTRAMUSCULAR | Status: DC | PRN
  Administered 2011-07-16: 2 mg via INTRAVENOUS

## 2011-07-16 MED ORDER — FENTANYL CITRATE 0.05 MG/ML IJ SOLN
INTRAMUSCULAR | Status: AC
  Filled 2011-07-16: qty 5

## 2011-07-16 MED ORDER — HYDROMORPHONE HCL PF 1 MG/ML IJ SOLN: 0.2500 mg | INTRAMUSCULAR | Status: DC | PRN

## 2011-07-16 MED ORDER — SODIUM CHLORIDE 0.9 % IJ SOLN: 9.0000 mL | INTRAMUSCULAR | Status: DC | PRN

## 2011-07-16 MED ORDER — LIDOCAINE HCL (CARDIAC) 20 MG/ML IV SOLN
INTRAVENOUS | Status: DC | PRN
  Administered 2011-07-16: 60 mg via INTRAVENOUS

## 2011-07-16 MED ORDER — KETOROLAC TROMETHAMINE 30 MG/ML IJ SOLN: 15.0000 mg | Freq: Once | INTRAMUSCULAR | Status: DC | PRN

## 2011-07-16 MED ORDER — OXYCODONE-ACETAMINOPHEN 5-325 MG PO TABS
1.0000 | ORAL_TABLET | ORAL | Status: DC | PRN
  Administered 2011-07-17 (×2): 1 via ORAL
  Filled 2011-07-16 (×3): qty 1

## 2011-07-16 MED ORDER — 0.9 % SODIUM CHLORIDE (POUR BTL) OPTIME
TOPICAL | Status: DC | PRN
  Administered 2011-07-16 (×2): 1000 mL

## 2011-07-16 MED ORDER — PANTOPRAZOLE SODIUM 40 MG PO TBEC
DELAYED_RELEASE_TABLET | ORAL | Status: AC
  Administered 2011-07-16: 40 mg via ORAL
  Filled 2011-07-16: qty 1

## 2011-07-16 MED ORDER — HYDROMORPHONE 0.3 MG/ML IV SOLN
INTRAVENOUS | Status: DC
  Administered 2011-07-16: 5 mg via INTRAVENOUS
  Administered 2011-07-16: 17:00:00 via INTRAVENOUS
  Administered 2011-07-16: 3 mL via INTRAVENOUS
  Administered 2011-07-17 (×2): 2 mL via INTRAVENOUS

## 2011-07-16 MED ORDER — LIDOCAINE HCL (CARDIAC) 20 MG/ML IV SOLN
INTRAVENOUS | Status: AC
  Filled 2011-07-16: qty 5

## 2011-07-16 MED ORDER — LACTATED RINGERS IV SOLN
INTRAVENOUS | Status: DC
  Administered 2011-07-16 – 2011-07-17 (×2): via INTRAVENOUS

## 2011-07-16 MED ORDER — DEXTROSE 5 % IV SOLN
1.0000 g | INTRAVENOUS | Status: AC
Start: 2011-07-16 — End: 2011-07-16
  Administered 2011-07-16: 1 g via INTRAVENOUS
  Filled 2011-07-16: qty 1

## 2011-07-16 MED ORDER — PROPOFOL 10 MG/ML IV EMUL
INTRAVENOUS | Status: AC
  Filled 2011-07-16: qty 20

## 2011-07-16 MED ORDER — IBUPROFEN 600 MG PO TABS
600.0000 mg | ORAL_TABLET | Freq: Four times a day (QID) | ORAL | Status: DC | PRN
  Administered 2011-07-17 – 2011-07-18 (×2): 600 mg via ORAL
  Filled 2011-07-16 (×2): qty 1

## 2011-07-16 MED ORDER — FENTANYL CITRATE 0.05 MG/ML IJ SOLN
INTRAMUSCULAR | Status: DC | PRN
  Administered 2011-07-16 (×3): 100 ug via INTRAVENOUS
  Administered 2011-07-16: 50 ug via INTRAVENOUS

## 2011-07-16 MED ORDER — MIDAZOLAM HCL 2 MG/2ML IJ SOLN
INTRAMUSCULAR | Status: AC
  Filled 2011-07-16: qty 2

## 2011-07-16 MED ORDER — HYDROMORPHONE 0.3 MG/ML IV SOLN
INTRAVENOUS | Status: AC
  Filled 2011-07-16: qty 25

## 2011-07-16 MED ORDER — DEXAMETHASONE SODIUM PHOSPHATE 10 MG/ML IJ SOLN
INTRAMUSCULAR | Status: AC
  Filled 2011-07-16: qty 1

## 2011-07-16 MED ORDER — LACTATED RINGERS IV SOLN
INTRAVENOUS | Status: DC
  Administered 2011-07-16 (×4): via INTRAVENOUS

## 2011-07-16 MED ORDER — ROCURONIUM BROMIDE 100 MG/10ML IV SOLN
INTRAVENOUS | Status: DC | PRN
  Administered 2011-07-16 (×2): 10 mg via INTRAVENOUS
  Administered 2011-07-16: 40 mg via INTRAVENOUS

## 2011-07-16 MED ORDER — PANTOPRAZOLE SODIUM 40 MG PO TBEC
40.0000 mg | DELAYED_RELEASE_TABLET | Freq: Once | ORAL | Status: AC
  Administered 2011-07-16: 40 mg via ORAL

## 2011-07-16 SURGICAL SUPPLY — 45 items
APPLICATOR COTTON TIP 6IN STRL (MISCELLANEOUS) ×3 IMPLANT
BENZOIN TINCTURE PRP APPL 2/3 (GAUZE/BANDAGES/DRESSINGS) ×3 IMPLANT
BINDER ABD UNIV 10 28-50 (GAUZE/BANDAGES/DRESSINGS) ×2 IMPLANT
BINDER ABDOM UNIV 10 (GAUZE/BANDAGES/DRESSINGS) ×3
CANISTER SUCTION 2500CC (MISCELLANEOUS) ×3 IMPLANT
CLOTH BEACON ORANGE TIMEOUT ST (SAFETY) ×3 IMPLANT
CONT PATH 16OZ SNAP LID 3702 (MISCELLANEOUS) ×3 IMPLANT
DECANTER SPIKE VIAL GLASS SM (MISCELLANEOUS) IMPLANT
DRESSING TELFA 8X3 (GAUZE/BANDAGES/DRESSINGS) ×3 IMPLANT
DRSG COVADERM 4X10 (GAUZE/BANDAGES/DRESSINGS) ×3 IMPLANT
GAUZE SPONGE 4X4 16PLY XRAY LF (GAUZE/BANDAGES/DRESSINGS) ×3 IMPLANT
GLOVE BIOGEL M 6.5 STRL (GLOVE) ×3 IMPLANT
GLOVE BIOGEL PI IND STRL 6.5 (GLOVE) ×4 IMPLANT
GLOVE BIOGEL PI INDICATOR 6.5 (GLOVE) ×2
GOWN PREVENTION PLUS LG XLONG (DISPOSABLE) ×6 IMPLANT
GOWN PREVENTION PLUS XLARGE (GOWN DISPOSABLE) ×3 IMPLANT
NEEDLE HYPO 25X1 1.5 SAFETY (NEEDLE) IMPLANT
NS IRRIG 1000ML POUR BTL (IV SOLUTION) ×3 IMPLANT
PACK ABDOMINAL GYN (CUSTOM PROCEDURE TRAY) ×3 IMPLANT
PAD ABD 7.5X8 STRL (GAUZE/BANDAGES/DRESSINGS) ×3 IMPLANT
PAD OB MATERNITY 4.3X12.25 (PERSONAL CARE ITEMS) ×3 IMPLANT
PROTECTOR NERVE ULNAR (MISCELLANEOUS) ×3 IMPLANT
SPONGE GAUZE 4X4 12PLY (GAUZE/BANDAGES/DRESSINGS) ×3 IMPLANT
SPONGE LAP 18X18 X RAY DECT (DISPOSABLE) ×9 IMPLANT
STAPLER VISISTAT 35W (STAPLE) IMPLANT
STRIP CLOSURE SKIN 1/2X4 (GAUZE/BANDAGES/DRESSINGS) ×3 IMPLANT
SUT PDS AB 0 CT1 27 (SUTURE) ×12 IMPLANT
SUT PDS AB 1 CTX 36 (SUTURE) IMPLANT
SUT VIC AB 0 CT1 18XCR BRD8 (SUTURE) IMPLANT
SUT VIC AB 0 CT1 27 (SUTURE) ×1
SUT VIC AB 0 CT1 27XCR 8 STRN (SUTURE) ×2 IMPLANT
SUT VIC AB 0 CT1 36 (SUTURE) ×9 IMPLANT
SUT VIC AB 0 CT1 8-18 (SUTURE)
SUT VIC AB 2-0 CT1 (SUTURE) IMPLANT
SUT VIC AB 2-0 CT1 27 (SUTURE) ×2
SUT VIC AB 2-0 CT1 TAPERPNT 27 (SUTURE) ×4 IMPLANT
SUT VIC AB 2-0 SH 27 (SUTURE) ×3
SUT VIC AB 2-0 SH 27XBRD (SUTURE) ×6 IMPLANT
SUT VIC AB 4-0 KS 27 (SUTURE) ×3 IMPLANT
SUT VICRYL 0 TIES 12 18 (SUTURE) ×3 IMPLANT
SYR CONTROL 10ML LL (SYRINGE) IMPLANT
TAPE CLOTH SURG 4X10 WHT LF (GAUZE/BANDAGES/DRESSINGS) ×3 IMPLANT
TOWEL OR 17X24 6PK STRL BLUE (TOWEL DISPOSABLE) ×6 IMPLANT
TRAY FOLEY CATH 14FR (SET/KITS/TRAYS/PACK) ×3 IMPLANT
WATER STERILE IRR 1000ML POUR (IV SOLUTION) ×3 IMPLANT

## 2011-07-16 NOTE — Anesthesia Procedure Notes (Signed)
Procedure Name: Intubation Date/Time: 07/16/2011 12:16 PM Performed by: Kendal Hymen Pre-anesthesia Checklist: Patient identified, Timeout performed, Emergency Drugs available, Suction available and Patient being monitored Patient Re-evaluated:Patient Re-evaluated prior to inductionOxygen Delivery Method: Circle system utilized and Simple face mask Preoxygenation: Pre-oxygenation with 100% oxygen Intubation Type: IV induction Ventilation: Mask ventilation without difficulty Laryngoscope Size: 2 Grade View: Grade I Tube type: Oral Number of attempts: 1 Airway Equipment and Method: Stylet Placement Confirmation: ETT inserted through vocal cords under direct vision,  positive ETCO2 and breath sounds checked- equal and bilateral Secured at: 21 cm Tube secured with: Tape Dental Injury: Teeth and Oropharynx as per pre-operative assessment

## 2011-07-16 NOTE — Anesthesia Postprocedure Evaluation (Signed)
Anesthesia Post Note  Patient: Melanie Lane  Procedure(s) Performed: Procedure(s) (LRB): HYSTERECTOMY ABDOMINAL (N/A) BILATERAL SALPINGECTOMY (Bilateral)  Anesthesia type: General  Patient location: PACU  Post pain: Pain level controlled  Post assessment: Post-op Vital signs reviewed  Last Vitals:  Filed Vitals:   07/16/11 1500  BP: 114/64  Pulse: 69  Temp:   Resp: 21    Post vital signs: Reviewed  Level of consciousness: sedated  Complications: No apparent anesthesia complicationsfj

## 2011-07-16 NOTE — Anesthesia Preprocedure Evaluation (Signed)
Anesthesia Evaluation  Patient identified by MRN, date of birth, ID band Patient awake    Reviewed: Allergy & Precautions, H&P , NPO status , Patient's Chart, lab work & pertinent test results, reviewed documented beta blocker date and time   History of Anesthesia Complications Negative for: history of anesthetic complications  Airway Mallampati: I TM Distance: >3 FB Neck ROM: full    Dental  (+) Teeth Intact Permanent retainers:   Pulmonary neg pulmonary ROS,  breath sounds clear to auscultation  Pulmonary exam normal       Cardiovascular Exercise Tolerance: Good negative cardio ROS  Rhythm:regular Rate:Normal     Neuro/Psych  Headaches (migraines - infrequent), negative psych ROS   GI/Hepatic negative GI ROS, Neg liver ROS,   Endo/Other  Diabetes mellitus- (diet and exercise controlled), Type 2  Renal/GU negative Renal ROS  Female GU complaint     Musculoskeletal   Abdominal Normal abdominal exam  (+)   Peds  Hematology negative hematology ROS (+)   Anesthesia Other Findings   Reproductive/Obstetrics negative OB ROS                           Anesthesia Physical Anesthesia Plan  ASA: II  Anesthesia Plan: General ETT   Post-op Pain Management:    Induction:   Airway Management Planned:   Additional Equipment:   Intra-op Plan:   Post-operative Plan:   Informed Consent: I have reviewed the patients History and Physical, chart, labs and discussed the procedure including the risks, benefits and alternatives for the proposed anesthesia with the patient or authorized representative who has indicated his/her understanding and acceptance.   Dental Advisory Given  Plan Discussed with: CRNA and Surgeon  Anesthesia Plan Comments:         Anesthesia Quick Evaluation

## 2011-07-16 NOTE — H&P (Signed)
Date of Initial H&P:07/06/2011 History reviewed, patient examined, no change in status, stable for surgery.  Pt had a negative cardiac stress test 07/10/2011 to evaluate chest pain that occurred the end of March 2013. She denies any further episodes of chest pain.

## 2011-07-16 NOTE — Op Note (Signed)
Hysterectomy Procedure Note  Indications: 46 y/o with recurrent ovarian cyst and pelvic pain   Pre-operative Diagnosis:  1 pelvic pain . 2 Ovarian cyst  3 Anemia   Post-operative Diagnosis: Same  Operation: Total abdominal hysterectomy, bilateral salpingo-oophorectomy  Surgeon: Jessee Avers MD  Assistants: Dr. Geryl Rankins   Anesthesia: General endotracheal anesthesia  ASA Class: 2    Procedure Details  The patient was seen in the Holding Room. The risks, benefits, complications, treatment options, and expected outcomes were discussed with the patient.  The patient concurred with the proposed plan, giving informed consent.  The site of surgery properly noted/marked. The patient was taken to Operating Room # 8, identified as Melanie Lane and the procedure verified as Total abdominal hysterectomy, bilateral salpingo-oophorectomy. A Time Out was held and the above information confirmed.  After induction of anesthesia, the patient was draped and prepped in the usual sterile manner. Pt was placed in supine position after anesthesia and draped and prepped in the usual sterile manner. Foley catheter was placed.  A pfannensteihl  incision was made and carried through the subcutaneous tissue to the fascia. Fascial incision was made and extended laterally . The rectus muscles were separated. The peritoneum was identified and entered. Peritoneal incision was extended longitudinally.  The above findings were noted. O connor sullivan retractor was placed and bowel was packed away from the surgical site.   The round ligaments were identified, cut, and ligated with 0-Vicryl. The anterior peritoneal reflection was incised and the bladder was dissected off the lower uterine segment. The retroperitoneal space was explored and the ureters were identified bilaterally. The right infundibulo-pelvic ligament was grasped, cut, and suture ligated with 0-Vicryl. The left infundibulo-pelvic ligament was  grasped, cut and suture ligated with 0-Vicryl. Hemostasis  was observed. The uterine vessels were skeletonized, then clamped, cut and suture ligated with 0-Vicryl suture. Serial pedicles of the cardinal and utero-sacral ligaments were clamped, cut, and suture ligated with 0-Vicryl. Entrance was made into the vagina and the uterus removed. Vaginal cuff angle sutures were placed incorporating the utero-sacral ligaments for support. The vaginal cuff was then closed with a running stitch of 0- Vicryl. Lavage was carried out until clear. Hemostasis was observed. Avitene was placed in the retroperitoneal spaces bilaterally and along the vaginal cuff.   Retractor and all packing was removed from the abdomen. The fascia was approximated with running sutures of 0-PDS. Lavage was again carried out. Hemostasis was observed.  The subcutaneous tissue was reapproximated with 2-0 vicryl. The skin was approximated with 4-0 vicryl.  Instrument, sponge, and needle counts were correct prior to abdominal closure and at the conclusion of the case.   Findings: Multiple ovarian cyst 10 wk size uterus   Estimated Blood Loss:  less than 100 mL         Drains: Foley          Total IV Fluids: per anesthesia         Specimens: uterus cervix bilateral fallopian tubes and ovaries          Specimen was sent to pathology  Implants: None         Complications:  None; patient tolerated the procedure well.         Disposition: PACU - hemodynamically stable.         Condition: stable  Attending Attestation: I performed the procedure.

## 2011-07-16 NOTE — Transfer of Care (Signed)
Immediate Anesthesia Transfer of Care Note  Patient: Melanie Lane  Procedure(s) Performed: Procedure(s) (LRB): HYSTERECTOMY ABDOMINAL (N/A) BILATERAL SALPINGECTOMY (Bilateral)  Patient Location: PACU  Anesthesia Type: General  Level of Consciousness: awake, alert  and oriented  Airway & Oxygen Therapy: Patient Spontanous Breathing and Patient connected to nasal cannula oxygen  Post-op Assessment: Report given to PACU RN and Post -op Vital signs reviewed and stable  Post vital signs: Reviewed and stable  Complications: No apparent anesthesia complications

## 2011-07-17 LAB — CBC
HCT: 29.5 % — ABNORMAL LOW (ref 36.0–46.0)
Hemoglobin: 9.9 g/dL — ABNORMAL LOW (ref 12.0–15.0)
MCH: 28.1 pg (ref 26.0–34.0)
MCHC: 33.6 g/dL (ref 30.0–36.0)
RDW: 13.8 % (ref 11.5–15.5)

## 2011-07-17 NOTE — Progress Notes (Signed)
UR Chart review completed.  

## 2011-07-17 NOTE — Anesthesia Postprocedure Evaluation (Signed)
  Anesthesia Post-op Note  Patient: Melanie Lane  Procedure(s) Performed: Procedure(s) (LRB): HYSTERECTOMY ABDOMINAL (N/A) BILATERAL SALPINGECTOMY (Bilateral)  Patient Location: PACU  Anesthesia Type: General  Level of Consciousness: awake  Airway and Oxygen Therapy: Patient Spontanous Breathing  Post-op Pain: none  Post-op Assessment: Patient's Cardiovascular Status Stable, Respiratory Function Stable, No signs of Nausea or vomiting, Adequate PO intake and Pain level controlled  Post-op Vital Signs: Reviewed and stable  Complications: No apparent anesthesia complications

## 2011-07-17 NOTE — Progress Notes (Signed)
Subjective: Patient reports incisional pain, tolerating PO and no problems voiding.    Objective: I have reviewed patient's vital signs, intake and output, medications and labs.  General: alert and cooperative Resp: clear to auscultation bilaterally Cardio: regular rate and rhythm, S1, S2 normal, no murmur, click, rub or gallop GI: normal findings: soft appropriately tender nondistended + BS.. incision without erythema or exudate Extremities: extremities normal, atraumatic, no cyanosis or edema   Assessment/Plan: POD #1 s/p TAH / BSO doing well Awaiting bowel function.  She denies hot flashes.. She does not desire to start ERT at this time Plan on discharge home tomorrow if pt reports flatus .  D/C home with motrin and percocet.   LOS: 1 day    Krishan Mcbreen J. 07/17/2011, 3:13 PM

## 2011-07-17 NOTE — Addendum Note (Signed)
Addendum  created 07/17/11 0810 by Suella Grove, CRNA   Modules edited:Notes Section

## 2011-07-18 ENCOUNTER — Encounter (HOSPITAL_COMMUNITY): Payer: Self-pay | Admitting: *Deleted

## 2011-07-18 LAB — GLUCOSE, CAPILLARY
Glucose-Capillary: 124 mg/dL — ABNORMAL HIGH (ref 70–99)
Glucose-Capillary: 107 mg/dL — ABNORMAL HIGH (ref 70–99)

## 2011-07-18 MED ORDER — POLYETHYLENE GLYCOL 3350 17 G PO PACK
17.0000 g | PACK | Freq: Every day | ORAL | Status: DC | PRN
  Filled 2011-07-18: qty 1

## 2011-07-18 MED ORDER — BISACODYL 10 MG RE SUPP
10.0000 mg | Freq: Every day | RECTAL | Status: DC | PRN
  Administered 2011-07-18: 10 mg via RECTAL
  Filled 2011-07-18: qty 1

## 2011-07-18 NOTE — Discharge Summary (Signed)
Physician Discharge Summary  Patient ID: Melanie Lane MRN: [id number] DOB/AGE: 47/26/1967 47 y.o.  Admit date: 07/16/2011 Discharge date: 07/18/2011  Admission Diagnoses:  Pelvic pain, Ovarian cyst  Discharge Diagnoses: s/p hysterectomy Active Problems:  * No active hospital problems. *    Discharged Condition: good  Hospital Course:  Admitted for surgery 07/16/11 .  Post op course uncomplicated.  Discharged on 2nd post op day in good condition.  Consults:  na  Significant Diagnostic Studies: na  Treatments: Hysterectomy  Discharge Exam: Blood pressure 115/74, pulse 64, temperature 98.5 F (36.9 C), temperature source Oral, resp. rate 18, height 5\' 1"  (1.549 m), weight 166 lb (75.297 kg), SpO2 100.00%. General appearance: alert, cooperative and no distress Resp: clear to auscultation bilaterally Chest wall: no tenderness Cardio: regular rate and rhythm, S1, S2 normal, no murmur, click, rub or gallop GI: soft, non-tender; bowel sounds normal; no masses,  no organomegaly Extremities: extremities normal, atraumatic, no cyanosis or edema Skin: Skin color, texture, turgor normal. No rashes or lesions Incision/Wound:  Disposition: 01-Home or Self Care   Medication List  As of 07/18/2011 11:50 AM   ASK your doctor about these medications         acetaminophen 500 MG tablet   Commonly known as: TYLENOL   Take 1,000 mg by mouth every 6 (six) hours as needed. For pain             Signed: Fortino Sic 07/18/2011, 11:50 AM

## 2011-07-18 NOTE — Discharge Instructions (Addendum)
Hysterectomy Care After These instructions give you information on caring for yourself after your procedure. Your doctor may also give you more specific instructions. Call your doctor if you have any problems or questions after your procedure. HOME CARE Healing takes time. You may have discomfort, tenderness, puffiness (swelling), and bruising at the wound site. This may last for 2 weeks. This is normal and will get better.  Only take medicine as told by your doctor.   Do not take aspirin.   Do not drive when taking pain medicine.   Exercise, lift objects, drive, and get back to daily activites as told by your doctor.   Get back to your normal diet and activities as told by your doctor.   Get plenty of rest and sleep.   Do not douche, use tampons, or have sex (intercourse) for at least 6 weeks or as told.   Change your bandages (dressings) as told by your doctor.   Take your temperature during the day.   Take showers for 2 to 3 weeks. Do not take baths.   Do not drink alcohol until your doctor says it is okay.   Take a medicine to help you poop (laxative) as told by your doctor. Try eating bran foods. Drink enough fluids to keep your pee (urine) clear or pale yellow.   Have someone help you at home for 1 to 2 weeks after your surgery.   Keep follow-up doctor visits as told.  GET HELP RIGHT AWAY IF:   You have a fever of 100.4 or greater   You have bad belly (abdominal) pain.   You have chest pain.   You are short of breath.   You pass out (faint).   You have pain, puffiness, or redness of your leg.   You bleed a lot from your vagina and notice clumps of tissue (clots).   You have puffiness, redness, or pain where a tube was put in your vein (IV) or in the wound area.   You have yellowish-white fluid (pus) coming from the wound.   You have a bad smell coming from the wound or bandage.   Your wound pulls apart.   You feel dizzy or lightheaded.   You have pain  or bleeding when you pee.   You keep having watery poop (diarrhea).   You keep feeling sick to your stomach (nauseous) or keep throwing up (vomiting).   You have fluid (discharge) coming from your vagina.   You have a rash.   You have a reaction to your medicine.   You need stronger pain medicine.  MAKE SURE YOU:  Understand these instructions.   Will watch your condition.   Will get help right away if you are not doing well or get worse.  Document Released: 12/31/2007 Document Revised: 03/12/2011 Document Reviewed: 11/07/2010 [facility] Patient Information 2012 [place], [place].

## 2011-07-18 NOTE — Progress Notes (Signed)
2 Days Post-Op Procedure(s) (LRB): HYSTERECTOMY ABDOMINAL (N/A) BILATERAL SALPINGECTOMY (Bilateral)  Subjective: Patient reports tolerating PO, + flatus, + BM and no problems voiding.    Objective: I have reviewed patient's vital signs, intake and output and labs.  General: alert, cooperative and no distress Resp: clear to auscultation bilaterally Cardio: regular rate and rhythm, S1, S2 normal, no murmur, click, rub or gallop GI: soft, non-tender; bowel sounds normal; no masses,  no organomegaly Extremities: extremities normal, atraumatic, no cyanosis or edema Vaginal Bleeding: minimal  Assessment: s/p Procedure(s): HYSTERECTOMY ABDOMINAL BILATERAL SALPINGECTOMY: stable  Plan: Discharge home  LOS: 2 days    Rik Wadel E 07/18/2011, 11:48 AM

## 2011-07-18 NOTE — Progress Notes (Signed)
Discharge instructions reviewed with patient.  Patient states understanding of home care.  No home equipment needed. Discharged home with family.  Ambulated to car with staff without incident.

## 2011-07-20 ENCOUNTER — Encounter (HOSPITAL_COMMUNITY): Payer: Self-pay | Admitting: Obstetrics and Gynecology

## 2012-04-29 ENCOUNTER — Other Ambulatory Visit: Payer: Self-pay | Admitting: Internal Medicine

## 2012-04-29 DIAGNOSIS — M5430 Sciatica, unspecified side: Secondary | ICD-10-CM

## 2012-05-04 ENCOUNTER — Ambulatory Visit
Admission: RE | Admit: 2012-05-04 | Discharge: 2012-05-04 | Disposition: A | Discharge status: Home / Self Care | Payer: 59 | Source: Ambulatory Visit | Attending: Internal Medicine | Admitting: Internal Medicine

## 2012-05-04 DIAGNOSIS — M5430 Sciatica, unspecified side: Secondary | ICD-10-CM

## 2012-05-05 ENCOUNTER — Encounter (HOSPITAL_COMMUNITY): Payer: Self-pay | Admitting: Emergency Medicine

## 2012-05-05 ENCOUNTER — Emergency Department (HOSPITAL_COMMUNITY)
Admission: EM | Admit: 2012-05-05 | Discharge: 2012-05-05 | Disposition: A | Discharge status: Home / Self Care | Payer: 59 | Attending: Emergency Medicine | Admitting: Emergency Medicine

## 2012-05-05 DIAGNOSIS — E119 Type 2 diabetes mellitus without complications: Secondary | ICD-10-CM | POA: Insufficient documentation

## 2012-05-05 DIAGNOSIS — G43909 Migraine, unspecified, not intractable, without status migrainosus: Secondary | ICD-10-CM | POA: Insufficient documentation

## 2012-05-05 DIAGNOSIS — R110 Nausea: Secondary | ICD-10-CM | POA: Insufficient documentation

## 2012-05-05 DIAGNOSIS — Z79899 Other long term (current) drug therapy: Secondary | ICD-10-CM | POA: Insufficient documentation

## 2012-05-05 DIAGNOSIS — R51 Headache: Secondary | ICD-10-CM

## 2012-05-05 MED ORDER — MAGNESIUM SULFATE 40 MG/ML IJ SOLN
2.0000 g | Freq: Once | INTRAMUSCULAR | Status: AC
  Administered 2012-05-05: 2 g via INTRAVENOUS
  Filled 2012-05-05: qty 50

## 2012-05-05 MED ORDER — SODIUM CHLORIDE 0.9 % IV BOLUS (SEPSIS)
1000.0000 mL | Freq: Once | INTRAVENOUS | Status: AC
  Administered 2012-05-05: 1000 mL via INTRAVENOUS

## 2012-05-05 MED ORDER — SODIUM CHLORIDE 0.9 % IV SOLN
Freq: Once | INTRAVENOUS | Status: AC
  Administered 2012-05-05: 06:00:00 via INTRAVENOUS

## 2012-05-05 NOTE — ED Notes (Signed)
Pt c/o pressure in head onset after MRI yesterday. Denies blurred vision, photophobic. Pt states she feels "whoozy". Pt states this feels different than normal HA. +nausea. Aleve 1 hour ago.

## 2012-05-05 NOTE — ED Provider Notes (Signed)
History     CSN: [id number]  Arrival date & time 05/05/12  0454   First MD Initiated Contact with Patient 05/05/12 [phone]      Chief Complaint  Patient presents with  . Headache    (Consider location/radiation/quality/duration/timing/severity/associated sxs/prior treatment) HPI History provided by patient. Has history of migraine headaches and woke up this morning with headache all over, pressure-like in quality, moderate in severity with associated nausea. No vomiting. Take Aleve and hydrocodone without relief and presents here for evaluation. No difficulty with speech or ambulation. Has been having an exacerbation of low back pain with MRI yesterday. She has no new symptoms related to this. No numbness. No neck stiffness. No rash. No fevers. Light bothers her eyes. No sudden onset headache and not worse headache of life. Past Medical History  Diagnosis Date  . Diabetes mellitus     type 2-recent diagnosis-exercise and diet controlled  . Headache   . Joint swelling     Past Surgical History  Procedure Date  . Cholecystectomy   . Appendectomy   . Dilation and curettage of uterus   . Mouth surgery   . Abdominal hysterectomy 07/16/2011    Procedure: HYSTERECTOMY ABDOMINAL;  Surgeon: Dorien Chihuahua. Richardson Dopp, MD;  Location: [facility];  Service: Gynecology;  Laterality: N/A;    No family history on file.  History  Substance Use Topics  . Smoking status: Never Smoker   . Smokeless tobacco: Not on file  . Alcohol Use: No    OB History    Grav Para Term Preterm Abortions TAB SAB Ect Mult Living                  Review of Systems  Constitutional: Negative for fever and chills.  HENT: Negative for neck pain and neck stiffness.   Eyes: Negative for pain.  Respiratory: Negative for shortness of breath.   Cardiovascular: Negative for chest pain.  Gastrointestinal: Negative for abdominal pain.  Genitourinary: Negative for dysuria.  Musculoskeletal: Negative for back pain.  Skin: Negative  for rash.  Neurological: Positive for headaches. Negative for seizures, syncope and speech difficulty.  All other systems reviewed and are negative.    Allergies  Benadryl and Codeine  Home Medications   Current Outpatient Rx  Name  Route  Sig  Dispense  Refill  . HYDROCODONE-ACETAMINOPHEN 5-325 MG PO TABS   Oral   Take 1-2 tablets by mouth every 6 (six) hours as needed. For pain         . ALKA-SELTZER PLUS COLD & FLU PO   Oral   Take 1 tablet by mouth 2 (two) times daily as needed. For cold or flu symptoms         . NYQUIL PO   Oral   Take 2 capsules by mouth 2 (two) times daily as needed. For cold or flu symptoms           BP 141/73  Pulse 63  Temp 98.1 F (36.7 C) (Oral)  Resp 18  Ht 5\' 1"  (1.549 m)  Wt 168 lb (76.204 kg)  BMI 31.74 kg/m2  SpO2 100%  LMP 06/24/2011  Physical Exam  Constitutional: She is oriented to person, place, and time. She appears well-developed and well-nourished.  HENT:  Head: Normocephalic and atraumatic.  Eyes: Conjunctivae normal and EOM are normal. Pupils are equal, round, and reactive to light.  Neck: Full passive range of motion without pain. Neck supple. No thyromegaly present.       No  meningismus  Cardiovascular: Normal rate, regular rhythm, S1 normal, S2 normal and intact distal pulses.   Pulmonary/Chest: Effort normal and breath sounds normal.  Abdominal: Soft. Bowel sounds are normal. There is no tenderness. There is no CVA tenderness.  Musculoskeletal: Normal range of motion. She exhibits no edema and no tenderness.  Neurological: She is alert and oriented to person, place, and time. She has normal strength and normal reflexes. No cranial nerve deficit or sensory deficit. She displays a negative Romberg sign. GCS eye subscore is 4. GCS verbal subscore is 5. GCS motor subscore is 6.       No unilateral deficits  Skin: Skin is warm and dry. No rash noted. No cyanosis. Nails show no clubbing.  Psychiatric: She has a normal  mood and affect. Her speech is normal and behavior is normal.    ED Course  Procedures (including critical care time)  IV fluids. IV magnesium for headache  6:32 AM on recheck is feeling much better and feels comfortable to be discharged home. Plan continue medications and followup with primary care physician. Reliable historian and agrees to return precautions  MDM   Headache with history of migraines. No concerning features to suggest indication for emergent CT or MRI at this time. Improved with IV fluids and IV medications as above.  Vital signs nursing notes reviewed.  Old records reviewed and outpatient MRI from yesterday is without preliminary or final report available for review.        Sunnie Nielsen, MD 05/05/12 [phone]

## 2012-08-14 ENCOUNTER — Encounter (HOSPITAL_COMMUNITY): Payer: Self-pay | Admitting: *Deleted

## 2012-08-14 ENCOUNTER — Emergency Department (HOSPITAL_COMMUNITY)
Admission: EM | Admit: 2012-08-14 | Discharge: 2012-08-14 | Disposition: A | Discharge status: Home / Self Care | Payer: 59 | Attending: Emergency Medicine | Admitting: Emergency Medicine

## 2012-08-14 DIAGNOSIS — S0501XA Injury of conjunctiva and corneal abrasion without foreign body, right eye, initial encounter: Secondary | ICD-10-CM

## 2012-08-14 DIAGNOSIS — Y929 Unspecified place or not applicable: Secondary | ICD-10-CM | POA: Insufficient documentation

## 2012-08-14 DIAGNOSIS — Z8669 Personal history of other diseases of the nervous system and sense organs: Secondary | ICD-10-CM | POA: Insufficient documentation

## 2012-08-14 DIAGNOSIS — X58XXXA Exposure to other specified factors, initial encounter: Secondary | ICD-10-CM | POA: Insufficient documentation

## 2012-08-14 DIAGNOSIS — E119 Type 2 diabetes mellitus without complications: Secondary | ICD-10-CM | POA: Insufficient documentation

## 2012-08-14 DIAGNOSIS — S058X9A Other injuries of unspecified eye and orbit, initial encounter: Secondary | ICD-10-CM | POA: Insufficient documentation

## 2012-08-14 DIAGNOSIS — H53149 Visual discomfort, unspecified: Secondary | ICD-10-CM | POA: Insufficient documentation

## 2012-08-14 DIAGNOSIS — Y939 Activity, unspecified: Secondary | ICD-10-CM | POA: Insufficient documentation

## 2012-08-14 MED ORDER — FLUORESCEIN SODIUM 1 MG OP STRP
1.0000 | ORAL_STRIP | Freq: Once | OPHTHALMIC | Status: AC
  Administered 2012-08-14: 1 via OPHTHALMIC
  Filled 2012-08-14: qty 1

## 2012-08-14 MED ORDER — ERYTHROMYCIN 5 MG/GM OP OINT
TOPICAL_OINTMENT | Freq: Once | OPHTHALMIC | Status: AC
  Administered 2012-08-14: 1 via OPHTHALMIC
  Filled 2012-08-14: qty 3.5

## 2012-08-14 MED ORDER — PROPARACAINE HCL 0.5 % OP SOLN
2.0000 [drp] | Freq: Once | OPHTHALMIC | Status: AC
  Administered 2012-08-14: 2 [drp] via OPHTHALMIC
  Filled 2012-08-14: qty 15

## 2012-08-14 NOTE — ED Provider Notes (Signed)
Medical screening examination/treatment/procedure(s) were performed by non-physician practitioner and as supervising physician I was immediately available for consultation/collaboration.  Kima Malenfant, MD 08/14/12 0554 

## 2012-08-14 NOTE — ED Notes (Signed)
Pt c/o bilateral eye redness and swelling to R eye. Pt denies injury and exposure to illness. Pt denies allergies. Pt denies pain and c/o photophobia.

## 2012-08-14 NOTE — ED Provider Notes (Signed)
History     CSN: [id number]  Arrival date & time 08/14/12  0131   First MD Initiated Contact with Patient 08/14/12 [phone]      Chief Complaint  Patient presents with  . Eye Problem    (Consider location/radiation/quality/duration/timing/severity/associated sxs/prior treatment) HPI Comments: Patient states, that she was driving back from [place] when she arrived home.  Her daughter noted that her eyes were injected.  Patient states she has a foreign body sensation in the right more than the left.  Since arrival in the emergency department.  The left eye has pretty much resolved, and feels, better, but she still has the sensation in the right eye denies any seasonal allergies.  States she was not outside for a prolonged period of time today.  Does not wear contact lenses.  She is slightly photophobic in her right eye  Patient is a 47 y.o. female presenting with eye problem. The history is provided by the patient.  Eye Problem Location:  Both Quality:  Foreign body sensation Severity:  Mild Onset quality:  Sudden Timing:  Constant Progression:  Improving Chronicity:  New Relieved by:  None tried Worsened by:  Bright light Ineffective treatments:  None tried Associated symptoms: photophobia and redness   Associated symptoms: no blurred vision, no decreased vision, no discharge, no double vision, no headaches, no itching, no nausea and no tearing     Past Medical History  Diagnosis Date  . Diabetes mellitus     type 2-recent diagnosis-exercise and diet controlled  . Headache   . Joint swelling     Past Surgical History  Procedure Laterality Date  . Cholecystectomy    . Appendectomy    . Dilation and curettage of uterus    . Mouth surgery    . Abdominal hysterectomy  07/16/2011    Procedure: HYSTERECTOMY ABDOMINAL;  Surgeon: Dorien Chihuahua. Richardson Dopp, MD;  Location: [facility];  Service: Gynecology;  Laterality: N/A;    History reviewed. No pertinent family history.  History    Substance Use Topics  . Smoking status: Never Smoker   . Smokeless tobacco: Not on file  . Alcohol Use: No    OB History   Grav Para Term Preterm Abortions TAB SAB Ect Mult Living                  Review of Systems  Constitutional: Negative for fever.  HENT: Negative for rhinorrhea.   Eyes: Positive for photophobia and redness. Negative for blurred vision, double vision, pain, discharge and itching.  Gastrointestinal: Negative for nausea.  Allergic/Immunologic: Negative for environmental allergies, food allergies and immunocompromised state.  Neurological: Negative for dizziness and headaches.  All other systems reviewed and are negative.    Allergies  Benadryl and Codeine  Home Medications  No current outpatient prescriptions on file.  BP 136/87  Pulse 69  Temp(Src) 98.8 F (37.1 C) (Oral)  Resp 18  SpO2 100%  LMP 06/24/2011  Physical Exam  Nursing note and vitals reviewed. Constitutional: She appears well-developed and well-nourished.  Eyes: Pupils are equal, round, and reactive to light. Right eye exhibits no discharge. Left eye exhibits no discharge. Right conjunctiva is injected. Right conjunctiva has no hemorrhage. Left conjunctiva is injected. Left conjunctiva has no hemorrhage. No scleral icterus. Right eye exhibits no nystagmus. Left eye exhibits no nystagmus.  Slit lamp exam:      The right eye shows corneal abrasion and fluorescein uptake. The right eye shows no corneal flare, no corneal ulcer, no  foreign body, no hyphema, no hypopyon and no anterior chamber bulge.    Abrasion right eye, indicated, an illustration  Neck: Normal range of motion.  Cardiovascular: Normal rate and regular rhythm.   Pulmonary/Chest: Effort normal and breath sounds normal.  Abdominal: Soft.  Musculoskeletal: Normal range of motion.  Lymphadenopathy:    She has no cervical adenopathy.  Neurological: She is alert.  Skin: Skin is warm and dry.    ED Course  Procedures  (including critical care time)  Labs Reviewed - No data to display No results found.   1. Corneal abrasion, right, initial encounter       MDM   Will discharge patient home with erythromycin ointment to use 4 times a day.  She has an ophthalmologist, that she will followup with        Arman Filter, NP 08/14/12 0342  Arman Filter, NP 08/14/12 [phone]

## 2013-06-21 ENCOUNTER — Emergency Department (HOSPITAL_COMMUNITY)
Admission: EM | Admit: 2013-06-21 | Discharge: 2013-06-22 | Disposition: A | Discharge status: Home / Self Care | Payer: 59 | Attending: Emergency Medicine | Admitting: Emergency Medicine

## 2013-06-21 DIAGNOSIS — I10 Essential (primary) hypertension: Secondary | ICD-10-CM | POA: Insufficient documentation

## 2013-06-21 DIAGNOSIS — Z79899 Other long term (current) drug therapy: Secondary | ICD-10-CM | POA: Insufficient documentation

## 2013-06-21 DIAGNOSIS — Y929 Unspecified place or not applicable: Secondary | ICD-10-CM | POA: Insufficient documentation

## 2013-06-21 DIAGNOSIS — Y9389 Activity, other specified: Secondary | ICD-10-CM | POA: Insufficient documentation

## 2013-06-21 DIAGNOSIS — E119 Type 2 diabetes mellitus without complications: Secondary | ICD-10-CM | POA: Insufficient documentation

## 2013-06-21 DIAGNOSIS — W278XXA Contact with other nonpowered hand tool, initial encounter: Secondary | ICD-10-CM | POA: Insufficient documentation

## 2013-06-21 DIAGNOSIS — S60459A Superficial foreign body of unspecified finger, initial encounter: Secondary | ICD-10-CM | POA: Insufficient documentation

## 2013-06-21 NOTE — ED Provider Notes (Signed)
CSN: [id number]     Arrival date & time 06/21/13  2346 History   This chart was scribed for non-physician practitioner, Cleatrice Burke, PA-C working with Johnna Acosta, MD by Einar Pheasant, ED scribe. This patient was seen in room WTR7/WTR7 and the patient's care was started at 11:56 PM.    Chief Complaint  Patient presents with  . Foreign Body   The history is provided by the patient. No language interpreter was used.   HPI Comments: Melanie Lane is a 48 y.o. female who has a history of DM and hypertension presents to the Emergency Department complaining of a needle in her left index finger that occurred about 30 minutes ago. Pt describes the pain as sharp and as a 7/10. She states that her last tetanus vaccine was 2013. Melanie Lane states that she was using the sewing machine when the needle punctured index finger. The needle is still lodged in her left index finger. Denies any fever, chills, nausea, diaphoresis, or emesis.   Past Medical History  Diagnosis Date  . Diabetes mellitus     type 2-recent diagnosis-exercise and diet controlled  . Headache(784.0)   . Joint swelling    Past Surgical History  Procedure Laterality Date  . Cholecystectomy    . Appendectomy    . Dilation and curettage of uterus    . Mouth surgery    . Abdominal hysterectomy  07/16/2011    Procedure: HYSTERECTOMY ABDOMINAL;  Surgeon: Maeola Sarah. Landry Mellow, MD;  Location: [facility];  Service: Gynecology;  Laterality: N/A;   No family history on file. History  Substance Use Topics  . Smoking status: Never Smoker   . Smokeless tobacco: Not on file  . Alcohol Use: No   OB History   Grav Para Term Preterm Abortions TAB SAB Ect Mult Living                 Review of Systems  Constitutional: Negative for fever, chills and diaphoresis.  Gastrointestinal: Negative for nausea and vomiting.  Skin: Positive for wound (puncture wound).  All other systems reviewed and are negative.   Allergies  Benadryl and  Codeine  Home Medications   Current Outpatient Rx  Name  Route  Sig  Dispense  Refill  . Cholecalciferol (VITAMIN D3 ADULT GUMMIES PO)   Oral   Take 2 tablets by mouth daily.         . meloxicam (MOBIC) 7.5 MG tablet   Oral   Take 7.5 mg by mouth daily as needed for pain.          . metFORMIN (GLUCOPHAGE) 500 MG tablet   Oral   Take 500 mg by mouth 2 (two) times daily with a meal.         . Multiple Vitamins-Minerals (MULTIVITAMIN GUMMIES ADULTS PO)   Oral   Take 2 tablets by mouth daily.         . naproxen sodium (ANAPROX) 220 MG tablet   Oral   Take 220 mg by mouth 2 (two) times daily as needed (pain).         . traMADol (ULTRAM) 50 MG tablet   Oral   Take 50 mg by mouth every 6 (six) hours as needed for moderate pain.          . cephALEXin (KEFLEX) 500 MG capsule      2 caps po bid x 7 days   28 capsule   0     BP 149/90  Pulse 60  Temp(Src) 98.6 F (37 C) (Oral)  Resp 14  SpO2 96%  LMP 06/24/2011  Physical Exam  Nursing note and vitals reviewed. Constitutional: She is oriented to person, place, and time. She appears well-developed and well-nourished. No distress.  HENT:  Head: Normocephalic and atraumatic.  Right Ear: External ear normal.  Left Ear: External ear normal.  Nose: Nose normal.  Mouth/Throat: Oropharynx is clear and moist.  Eyes: Conjunctivae are normal.  Neck: Normal range of motion.  Cardiovascular: Normal rate, regular rhythm, normal heart sounds, intact distal pulses and normal pulses.   Pulses:      Radial pulses are 2+ on the right side, and 2+ on the left side.  Cap refill < 3 seconds in all fingers  Pulmonary/Chest: Effort normal and breath sounds normal. No stridor. No respiratory distress. She has no wheezes. She has no rales.  Abdominal: Soft. She exhibits no distension.  Musculoskeletal: Normal range of motion.  Neurological: She is alert and oriented to person, place, and time. She has normal strength.  Skin:  Skin is warm and dry. She is not diaphoretic. No erythema.  Needle through and through left distal index finger.  Psychiatric: She has a normal mood and affect. Her behavior is normal.    ED Course  FOREIGN BODY REMOVAL Date/Time: 06/22/2013 5:33 AM Performed by: Elwyn Lade Authorized by: Elwyn Lade Consent: Verbal consent obtained. written consent not obtained. The procedure was performed in an emergent situation. Risks and benefits: risks, benefits and alternatives were discussed Consent given by: patient Patient understanding: patient states understanding of the procedure being performed Patient consent: the patient's understanding of the procedure matches consent given Required items: required blood products, implants, devices, and special equipment available Patient identity confirmed: verbally with patient and arm band Time out: Immediately prior to procedure a "time out" was called to verify the correct patient, procedure, equipment, support staff and site/side marked as required. Body area: skin General location: upper extremity Location details: left index finger Anesthesia: local infiltration and digital block Local anesthetic: lidocaine 1% without epinephrine Patient sedated: no Patient restrained: no Patient cooperative: yes Localization method: visualized Removal mechanism: forceps Dressing: antibiotic ointment and dressing applied Tendon involvement: none Depth: deep Complexity: simple 1 objects recovered. Objects recovered: sewing needle Post-procedure assessment: foreign body removed Patient tolerance: Patient tolerated the procedure well with no immediate complications.   (including critical care time)  DIAGNOSTIC STUDIES: Oxygen Saturation was not taken by my interpretation.    COORDINATION OF CARE: 12:03 AM- Will order pain medication. Pt advised of plan for treatment and pt agrees.  Medications - No data to display  Labs Review Labs  Reviewed - No data to display Imaging Review Dg Finger Index Left  06/22/2013   CLINICAL DATA:  Sewing needle in the left index finger.  EXAM: LEFT INDEX FINGER 2+V  COMPARISON:  None.  FINDINGS: A portion of a needle is seen in the soft tissues of the distal left index finger on the ulnar side. The tip of the needle tip extends out of the volar soft tissues. The needle does not penetrate bone. There is no fracture.  IMPRESSION: Broken needle in the soft tissues of the left index finger. The needle does not penetrate bone.   Electronically Signed   By: Inge Rise M.D.   On: 06/22/2013 00:38     EKG Interpretation None      MDM   Final diagnoses:  Acute foreign body of index finger  Patient presents with a sewing needle in her left index finger. XR shows the needle does not penetrate bone. Needle was successfully removed after digital block was preformed. Patient tolerated the procedure well. Tdap UTD. Patient discharged with Keflex as she is a diabetic. Discussed reasons to return to the ED immediately. She will f/u with her PCP. Vital signs stable for discharge. Patient / Family / Caregiver informed of clinical course, understand medical decision-making process, and agree with plan.   I personally performed the services described in this documentation, which was scribed in my presence. The recorded information has been reviewed and is accurate.     Elwyn Lade, PA-C 06/22/13 [phone]

## 2013-06-22 ENCOUNTER — Emergency Department (HOSPITAL_COMMUNITY): Payer: 59

## 2013-06-22 ENCOUNTER — Encounter (HOSPITAL_COMMUNITY): Payer: Self-pay | Admitting: Emergency Medicine

## 2013-06-22 MED ORDER — CEPHALEXIN 500 MG PO CAPS: ORAL_CAPSULE | ORAL | Status: DC

## 2013-06-22 NOTE — ED Provider Notes (Signed)
Medical screening examination/treatment/procedure(s) were performed by non-physician practitioner and as supervising physician I was immediately available for consultation/collaboration.    Kristi Hyer D Theo Reither, MD 06/22/13 0605 

## 2013-06-22 NOTE — ED Notes (Signed)
Pt was sewing a sham and stuck in left index finger. Pt denies medication administration or any care of the injury prior to arrival to the hospital.

## 2013-06-22 NOTE — Discharge Instructions (Signed)
Today you had a piece of a needle removed from your finger. You were given antibiotics to prevent infection. Please return to the ED if you develop redness, green/yellow discharge, worsening pain, fevers, chills, or any symptoms you find concerning.

## 2013-09-25 ENCOUNTER — Other Ambulatory Visit: Payer: Self-pay

## 2013-09-25 DIAGNOSIS — Z1231 Encounter for screening mammogram for malignant neoplasm of breast: Secondary | ICD-10-CM

## 2013-10-02 ENCOUNTER — Ambulatory Visit: Payer: 59

## 2014-09-24 ENCOUNTER — Other Ambulatory Visit: Payer: Self-pay | Admitting: Obstetrics and Gynecology

## 2014-09-24 DIAGNOSIS — Z1231 Encounter for screening mammogram for malignant neoplasm of breast: Secondary | ICD-10-CM

## 2014-10-03 ENCOUNTER — Ambulatory Visit
Admission: RE | Admit: 2014-10-03 | Discharge: 2014-10-03 | Disposition: A | Discharge status: Home / Self Care | Payer: 59 | Source: Ambulatory Visit | Attending: Obstetrics and Gynecology | Admitting: Obstetrics and Gynecology

## 2014-10-03 DIAGNOSIS — Z1231 Encounter for screening mammogram for malignant neoplasm of breast: Secondary | ICD-10-CM

## 2015-11-23 ENCOUNTER — Emergency Department (HOSPITAL_COMMUNITY)
Admission: EM | Admit: 2015-11-23 | Discharge: 2015-11-23 | Disposition: A | Discharge status: Home / Self Care | Payer: 59 | Attending: Emergency Medicine | Admitting: Emergency Medicine

## 2015-11-23 ENCOUNTER — Encounter (HOSPITAL_COMMUNITY): Payer: Self-pay | Admitting: Emergency Medicine

## 2015-11-23 ENCOUNTER — Emergency Department (HOSPITAL_COMMUNITY): Payer: 59

## 2015-11-23 DIAGNOSIS — Y929 Unspecified place or not applicable: Secondary | ICD-10-CM | POA: Diagnosis not present

## 2015-11-23 DIAGNOSIS — S99922A Unspecified injury of left foot, initial encounter: Secondary | ICD-10-CM | POA: Diagnosis present

## 2015-11-23 DIAGNOSIS — S93402A Sprain of unspecified ligament of left ankle, initial encounter: Secondary | ICD-10-CM

## 2015-11-23 DIAGNOSIS — E119 Type 2 diabetes mellitus without complications: Secondary | ICD-10-CM | POA: Insufficient documentation

## 2015-11-23 DIAGNOSIS — Z7984 Long term (current) use of oral hypoglycemic drugs: Secondary | ICD-10-CM | POA: Insufficient documentation

## 2015-11-23 DIAGNOSIS — W230XXA Caught, crushed, jammed, or pinched between moving objects, initial encounter: Secondary | ICD-10-CM | POA: Diagnosis not present

## 2015-11-23 DIAGNOSIS — Y999 Unspecified external cause status: Secondary | ICD-10-CM | POA: Diagnosis not present

## 2015-11-23 DIAGNOSIS — S93492A Sprain of other ligament of left ankle, initial encounter: Secondary | ICD-10-CM | POA: Diagnosis not present

## 2015-11-23 DIAGNOSIS — Y939 Activity, unspecified: Secondary | ICD-10-CM | POA: Insufficient documentation

## 2015-11-23 MED ORDER — IBUPROFEN 400 MG PO TABS
400.0000 mg | ORAL_TABLET | Freq: Once | ORAL | Status: AC
  Administered 2015-11-23: 400 mg via ORAL
  Filled 2015-11-23: qty 1

## 2015-11-23 NOTE — Discharge Instructions (Signed)
Rest, Ice intermittently (in the first 24-48 hours), Gentle compression with an Ace wrap, and elevate (Limb above the level of the heart)   Take up to 800mg of ibuprofen (that is usually 4 over the counter pills)  3 times a day for 5 days. Take with food.  

## 2015-11-23 NOTE — ED Triage Notes (Signed)
Pt c/o slammed her left foot/ankle  in a folding chair about 30 mins PTA.

## 2015-11-23 NOTE — ED Provider Notes (Signed)
[facility] DEPT Provider Note   CSN: [id number] Arrival date & time: 11/23/15  1642  By signing my name below, I, Melanie Lane, attest that this documentation has been prepared under the direction and in the presence of [employer], PA-C.  Electronically Signed: [name], ED Scribe. 11/23/15. 6:18 PM.  History   Chief Complaint Chief Complaint  Patient presents with  . Foot Injury   The history is provided by the patient. No language interpreter was used.   HPI Comments: Melanie Lane is a 50 y.o. female with a PMHx of DM2, who presents to the Emergency Department complaining of sudden onset, gradually worsening, constant left foot/ankle pain s/p slamming her foot into a folding beach chair ~30 minutes PTA. Pt reports that she was standing in the chair when it suddenly closed on her foot, sustaining her pain. During the incident she states that she fell off of the chair and landed on a conrete floor and catching herself with her right foot. She additionally reports that since the incident her right foot has started to ache with pain as well. Her pain to both feet is exacerbated with ambulation or weight bearing. No OTC medications or home remedies tried PTA. Pt has been ambulatory since the incident with mild difficulty secondary to her pain. She denies numbness, or any other associated symptoms.   Past Medical History:  Diagnosis Date  . Diabetes mellitus    type 2-recent diagnosis-exercise and diet controlled  . Headache(784.0)   . Joint swelling    There are no active problems to display for this patient.  Past Surgical History:  Procedure Laterality Date  . ABDOMINAL HYSTERECTOMY  07/16/2011   Procedure: HYSTERECTOMY ABDOMINAL;  Surgeon: Maeola Sarah. Landry Mellow, MD;  Location: [facility];  Service: Gynecology;  Laterality: N/A;  . APPENDECTOMY    . CHOLECYSTECTOMY    . DILATION AND CURETTAGE OF UTERUS    . MOUTH SURGERY     OB History    No data available      Home Medications    Prior to Admission medications   Medication Sig Start Date End Date Taking? Authorizing Provider  cephALEXin (KEFLEX) 500 MG capsule 2 caps po bid x 7 days 06/22/13   Cleatrice Burke, PA-C  Cholecalciferol (VITAMIN D3 ADULT GUMMIES PO) Take 2 tablets by mouth daily.    Historical Provider, MD  meloxicam (MOBIC) 7.5 MG tablet Take 7.5 mg by mouth daily as needed for pain.  04/10/13   Historical Provider, MD  metFORMIN (GLUCOPHAGE) 500 MG tablet Take 500 mg by mouth 2 (two) times daily with a meal.    Historical Provider, MD  Multiple Vitamins-Minerals (MULTIVITAMIN GUMMIES ADULTS PO) Take 2 tablets by mouth daily.    Historical Provider, MD  naproxen sodium (ANAPROX) 220 MG tablet Take 220 mg by mouth 2 (two) times daily as needed (pain).    Historical Provider, MD  traMADol (ULTRAM) 50 MG tablet Take 50 mg by mouth every 6 (six) hours as needed for moderate pain.  04/10/13   Historical Provider, MD   Family History No family history on file.  Social History Social History  Substance Use Topics  . Smoking status: Never Smoker  . Smokeless tobacco: Never Used  . Alcohol use No   Allergies   Benadryl [diphenhydramine hcl] and Codeine  Review of Systems Review of Systems A complete 10 system review of systems was obtained and all systems are negative except as noted in the HPI and PMH.  Physical Exam Updated Vital Signs BP 125/84 (BP Location: Left Arm)   Pulse 93   Temp 98 F (36.7 C) (Oral)   Resp 20   Wt 169 lb (76.7 kg)   LMP 06/24/2011   SpO2 100%   BMI 31.93 kg/m   Physical Exam  Constitutional: She appears well-developed and well-nourished.  HENT:  Head: Normocephalic.  Eyes: Conjunctivae are normal.  Cardiovascular: Normal rate.   Pulmonary/Chest: Effort normal. No respiratory distress.  Abdominal: She exhibits no distension.  Musculoskeletal: Normal range of motion. She exhibits tenderness. She exhibits no edema or deformity.  Bilateral feet  and ankles with no deformity, neurovascularly intact with excellent range of motion, no swelling, ecchymosis, lacerations, contusions or abrasions. Patient has no focal bony tenderness to palpation.  Neurological: She is alert.  Skin: Skin is warm and dry.  Psychiatric: She has a normal mood and affect. Her behavior is normal.  Nursing note and vitals reviewed.  ED Treatments / Results  DIAGNOSTIC STUDIES: Oxygen Saturation is 100% on RA, normal by my interpretation.   COORDINATION OF CARE: -Discussed next steps with pt including DG left foot/ankle and f/u with orthopedics. Pt verbalized understanding and is agreeable with the plan.   EKG Radiology Dg Ankle Complete Left  Result Date: 11/23/2015 CLINICAL DATA:  Foley chair closed on foot and ankle with medial pain and swelling, initial encounter EXAM: LEFT ANKLE COMPLETE - 3+ VIEW COMPARISON:  None. FINDINGS: Ankle swelling is both medially and laterally. No acute fracture or dislocation is noted. IMPRESSION: Soft tissue swelling without acute bony abnormality. Electronically Signed   By: Inez Catalina M.D.   On: 11/23/2015 18:00   Dg Foot Complete Left  Result Date: 11/23/2015 CLINICAL DATA:  Fold and chair closed on foot today with pain, initial encounter EXAM: LEFT FOOT - COMPLETE 3+ VIEW COMPARISON:  None. FINDINGS: Mild soft tissue swelling is noted about the ankle. No acute fracture or dislocation is seen. IMPRESSION: Soft tissue swelling about the ankle. No acute bony abnormality is noted. Electronically Signed   By: Inez Catalina M.D.   On: 11/23/2015 18:01   Procedures Procedures (including critical care time)  Medications Ordered in ED Medications - No data to display  Initial Impression / Assessment and Plan / ED Course  I have reviewed the triage vital signs and the nursing notes.  Pertinent labs & imaging results that were available during my care of the patient were reviewed by me and considered in my medical decision  making (see chart for details).  Clinical Course   Vitals:   11/23/15 1645 11/23/15 1646  BP: 125/84   Pulse: 93   Resp: 20   Temp: 98 F (36.7 C)   TempSrc: Oral   SpO2: 100%   Weight:  76.7 kg    Medications  ibuprofen (ADVIL,MOTRIN) tablet 400 mg (400 mg Oral Given 11/23/15 1839)  ibuprofen (ADVIL,MOTRIN) tablet 400 mg (400 mg Oral Given 11/23/15 1839)    ORRIE RAVELO is 50 y.o. female presenting with left foot pain after she was standing on a folding chair and it closed on her foot.  Neurovascularly intact with mild tenderness to palpation. X-rays negative. Pain started in the right foot while in the ED, no focal bony tenderness, doubt fracture. Patient ambulatory. Will treat with rest, ice, compression elevation, NSAIDS. Patient given crutches and an orthopedic follow-up.   Evaluation does not show pathology that would require ongoing emergent intervention or inpatient treatment. Pt is hemodynamically stable and mentating  appropriately. Discussed findings and plan with patient/guardian, who agrees with care plan. All questions answered. Return precautions discussed and outpatient follow up given.     Final Clinical Impressions(s) / ED Diagnoses   Final diagnoses:  Ankle sprain, left, initial encounter    I personally performed the services described in this documentation, which was scribed in my presence. The recorded information has been reviewed and is accurate.     Monico Blitz, PA-C 11/28/15 [place], DO 11/28/15 1500

## 2016-01-27 DIAGNOSIS — R55 Syncope and collapse: Secondary | ICD-10-CM | POA: Insufficient documentation

## 2016-02-05 ENCOUNTER — Other Ambulatory Visit: Payer: Self-pay | Admitting: Internal Medicine

## 2016-02-05 ENCOUNTER — Ambulatory Visit (HOSPITAL_COMMUNITY): Payer: 59 | Attending: Cardiovascular Disease

## 2016-02-05 ENCOUNTER — Ambulatory Visit (INDEPENDENT_AMBULATORY_CARE_PROVIDER_SITE_OTHER): Payer: 59

## 2016-02-05 ENCOUNTER — Encounter (INDEPENDENT_AMBULATORY_CARE_PROVIDER_SITE_OTHER): Payer: Self-pay

## 2016-02-05 ENCOUNTER — Other Ambulatory Visit: Payer: Self-pay

## 2016-02-05 DIAGNOSIS — I5030 Unspecified diastolic (congestive) heart failure: Secondary | ICD-10-CM | POA: Insufficient documentation

## 2016-02-05 DIAGNOSIS — R55 Syncope and collapse: Secondary | ICD-10-CM

## 2016-02-13 ENCOUNTER — Encounter: Payer: 59 | Attending: Internal Medicine | Admitting: Skilled Nursing Facility1

## 2016-02-13 ENCOUNTER — Encounter: Payer: Self-pay | Admitting: Skilled Nursing Facility1

## 2016-02-13 DIAGNOSIS — E119 Type 2 diabetes mellitus without complications: Secondary | ICD-10-CM | POA: Insufficient documentation

## 2016-02-13 DIAGNOSIS — Z713 Dietary counseling and surveillance: Secondary | ICD-10-CM | POA: Insufficient documentation

## 2016-02-13 NOTE — Progress Notes (Signed)
Diabetes Self-Management Education  Visit Type: First/Initial  Appt. Start Time: 8:30 Appt. End Time: 10:00  02/13/2016  [name], identified by name and date of birth, is a 50 y.o. female with a diagnosis of Diabetes: Type 2.   ASSESSMENT  Height 5\' 2"  (1.575 m), weight 172 lb (78 kg), last menstrual period 06/24/2011. Body mass index is 31.46 kg/m. Pt states she is not taking her metformin due to GI issues. Pt states she is an emotional eater. Pt states she has recently had 2 ankle sprains and passed out recently with unknown cause. Raw lettuce hurts stomach, stress hurts stomach, nauseas  Pt enjoys eating Margarine with brown sugar.     Diabetes Self-Management Education - 02/13/16 0840      Visit Information   Visit Type First/Initial     Initial Visit   Diabetes Type Type 2   Are you currently following a meal plan? No   Are you taking your medications as prescribed? No   Date Diagnosed 4-5 years ago     Health Coping   How would you rate your overall health? Good     Psychosocial Assessment   Patient Belief/Attitude about Diabetes Denial     Pre-Education Assessment   Patient understands the diabetes disease and treatment process. Needs Instruction   Patient understands incorporating nutritional management into lifestyle. Needs Instruction   Patient undertands incorporating physical activity into lifestyle. Needs Instruction   Patient understands using medications safely. Needs Instruction   Patient understands monitoring blood glucose, interpreting and using results Needs Instruction   Patient understands prevention, detection, and treatment of acute complications. Needs Instruction   Patient understands prevention, detection, and treatment of chronic complications. Needs Instruction   Patient understands how to develop strategies to address psychosocial issues. Needs Instruction   Patient understands how to develop strategies to promote health/change  behavior. Needs Instruction     Complications   Last HgB A1C per patient/outside source 6.6 %   How often do you check your blood sugar? 0 times/day (not testing)   Have you had a dilated eye exam in the past 12 months? Yes   Have you had a dental exam in the past 12 months? Yes   Are you checking your feet? Yes   How many days per week are you checking your feet? 5     Dietary Intake   Breakfast none---cereal---eggs bacon toast grits---oatmeal   Snack (morning) fruit   Lunch none-----fast food   Snack (afternoon) fruit   Dinner none----chicken, fish, spagetti----fast food   Beverage(s) water, diet soda, milk, juice     Exercise   Exercise Type ADL's     Patient Education   Previous Diabetes Education Yes (please comment)  litte remebered    Disease state  Definition of diabetes, type 1 and 2, and the diagnosis of diabetes;Factors that contribute to the development of diabetes   Nutrition management  Role of diet in the treatment of diabetes and the relationship between the three main macronutrients and blood glucose level;Food label reading, portion sizes and measuring food.;Carbohydrate counting;Reviewed blood glucose goals for pre and post meals and how to evaluate the patients' food intake on their blood glucose level.;Information on hints to eating out and maintain blood glucose control.;Meal options for control of blood glucose level and chronic complications.   Physical activity and exercise  Role of exercise on diabetes management, blood pressure control and cardiac health.   Monitoring Purpose and frequency of SMBG.;Yearly dilated eye  exam;Daily foot exams;Interpreting lab values - A1C, lipid, urine microalbumina.   Acute complications Taught treatment of hypoglycemia - the 15 rule.;Discussed and identified patients' treatment of hyperglycemia.   Chronic complications Relationship between chronic complications and blood glucose control;Lipid levels, blood glucose control and  heart disease;Dental care;Nephropathy, what it is, prevention of, the use of ACE, ARB's and early detection of through urine microalbumia.;Assessed and discussed foot care and prevention of foot problems;Retinopathy and reason for yearly dilated eye exams;Reviewed with patient heart disease, higher risk of, and prevention   Psychosocial adjustment Worked with patient to identify barriers to care and solutions;Role of stress on diabetes;Travel strategies;Identified and addressed patients feelings and concerns about diabetes     Individualized Goals (developed by patient)   Nutrition Follow meal plan discussed;Adjust meds/carbs with exercise as discussed   Physical Activity Exercise 1-2 times per week;15 minutes per day   Monitoring  test my blood glucose as discussed;test blood glucose pre and post meals as discussed     Post-Education Assessment   Patient understands the diabetes disease and treatment process. Demonstrates understanding / competency   Patient understands incorporating nutritional management into lifestyle. Demonstrates understanding / competency   Patient undertands incorporating physical activity into lifestyle. Demonstrates understanding / competency   Patient understands using medications safely. Demonstrates understanding / competency   Patient understands monitoring blood glucose, interpreting and using results Demonstrates understanding / competency   Patient understands prevention, detection, and treatment of acute complications. Demonstrates understanding / competency   Patient understands prevention, detection, and treatment of chronic complications. Demonstrates understanding / competency   Patient understands how to develop strategies to address psychosocial issues. Demonstrates understanding / competency   Patient understands how to develop strategies to promote health/change behavior. Demonstrates understanding / competency     Outcomes   Expected Outcomes  Demonstrated interest in learning. Expect positive outcomes   Future DMSE PRN   Program Status Completed      Individualized Plan for Diabetes Self-Management Training:   Learning Objective:  Patient will have a greater understanding of diabetes self-management. Patient education plan is to attend individual and/or group sessions per assessed needs and concerns.   Plan:   Patient Instructions  -Always bring your meter with you everywhere you go -Always Properly dispose of your needles:  -Discard in a hard plastic/metal container with a lid (something the needle can't puncture)  -Write Do Not Recycle on the outside of the container  -Example: A laundry detergent bottle -Never use the same needle more than once -Eat 2-3 carbohydrate choices for each meal and 1 for each snack -A meal: carbohydrates, protein, vegetable -A snack: A Fruit OR Vegetable AND Protein  -Try to be more active -Always pay attention to your body keeping watchful of possible low blood sugar (below 70) or high blood sugar (above 200)   -Check your blood sugar Tuesday and Thursday: on Tuesday before you eat anything (130 or lower) in the morning ON Thursday 2 hours after a meal (Lower than 180)  -Chew your foods 30 times per bite  -Keep up with examining the cause of your sugar numbers  -You are working towards the goal of eating every 3 hours with balanced meals and chewing your food very well  -First meal: apple and peanutbutter -Second meal: nuts, raisins, 4 carrot sticks -Third meal: grilled chicken wrap (be sure to chew very well) -Fourth meal: grapes, nuts,cucumbers -Fifth: half a chicken salad sandwich -Sixth meal: apple and peanutbutter  Expected Outcomes:  Demonstrated interest in learning. Expect positive outcomes  Education material provided: Living Well with Diabetes, Meal plan card, My Plate, Snack sheet and Support group flyer  If problems or questions, patient to contact team via:   Phone  Future DSME appointment: PRN

## 2016-02-13 NOTE — Patient Instructions (Addendum)
-  Always bring your meter with you everywhere you go -Always Properly dispose of your needles:  -Discard in a hard plastic/metal container with a lid (something the needle can't puncture)  -Write Do Not Recycle on the outside of the container  -Example: A laundry detergent bottle -Never use the same needle more than once -Eat 2-3 carbohydrate choices for each meal and 1 for each snack -A meal: carbohydrates, protein, vegetable -A snack: A Fruit OR Vegetable AND Protein  -Try to be more active -Always pay attention to your body keeping watchful of possible low blood sugar (below 70) or high blood sugar (above 200)   -Check your blood sugar Tuesday and Thursday: on Tuesday before you eat anything (130 or lower) in the morning ON Thursday 2 hours after a meal (Lower than 180)  -Chew your foods 30 times per bite  -Keep up with examining the cause of your sugar numbers  -You are working towards the goal of eating every 3 hours with balanced meals and chewing your food very well  -First meal: apple and peanutbutter -Second meal: nuts, raisins, 4 carrot sticks -Third meal: grilled chicken wrap (be sure to chew very well) -Fourth meal: grapes, nuts,cucumbers -Fifth: half a chicken salad sandwich -Sixth meal: apple and peanutbutter

## 2016-03-26 ENCOUNTER — Ambulatory Visit: Payer: 59 | Admitting: Skilled Nursing Facility1

## 2016-04-27 DIAGNOSIS — J069 Acute upper respiratory infection, unspecified: Secondary | ICD-10-CM | POA: Diagnosis not present

## 2016-05-12 DIAGNOSIS — E1165 Type 2 diabetes mellitus with hyperglycemia: Secondary | ICD-10-CM | POA: Diagnosis not present

## 2016-08-03 ENCOUNTER — Emergency Department (HOSPITAL_COMMUNITY)
Admission: EM | Admit: 2016-08-03 | Discharge: 2016-08-03 | Disposition: A | Discharge status: Home / Self Care | Payer: 59 | Attending: Emergency Medicine | Admitting: Emergency Medicine

## 2016-08-03 ENCOUNTER — Encounter (HOSPITAL_COMMUNITY): Payer: Self-pay | Admitting: Emergency Medicine

## 2016-08-03 ENCOUNTER — Emergency Department (HOSPITAL_COMMUNITY): Payer: 59

## 2016-08-03 DIAGNOSIS — R030 Elevated blood-pressure reading, without diagnosis of hypertension: Secondary | ICD-10-CM | POA: Insufficient documentation

## 2016-08-03 DIAGNOSIS — E1165 Type 2 diabetes mellitus with hyperglycemia: Secondary | ICD-10-CM | POA: Diagnosis not present

## 2016-08-03 DIAGNOSIS — R072 Precordial pain: Secondary | ICD-10-CM | POA: Diagnosis not present

## 2016-08-03 DIAGNOSIS — R0602 Shortness of breath: Secondary | ICD-10-CM | POA: Diagnosis present

## 2016-08-03 DIAGNOSIS — I4581 Long QT syndrome: Secondary | ICD-10-CM | POA: Diagnosis not present

## 2016-08-03 DIAGNOSIS — E119 Type 2 diabetes mellitus without complications: Secondary | ICD-10-CM

## 2016-08-03 DIAGNOSIS — R9431 Abnormal electrocardiogram [ECG] [EKG]: Secondary | ICD-10-CM

## 2016-08-03 DIAGNOSIS — R079 Chest pain, unspecified: Secondary | ICD-10-CM | POA: Diagnosis not present

## 2016-08-03 LAB — I-STAT TROPONIN, ED: TROPONIN I, POC: 0 ng/mL (ref 0.00–0.08)

## 2016-08-03 LAB — BASIC METABOLIC PANEL
Anion gap: 7 (ref 5–15)
BUN: 8 mg/dL (ref 6–20)
CHLORIDE: 105 mmol/L (ref 101–111)
CO2: 26 mmol/L (ref 22–32)
CREATININE: 0.8 mg/dL (ref 0.44–1.00)
Calcium: 9.2 mg/dL (ref 8.9–10.3)
Glucose, Bld: 186 mg/dL — ABNORMAL HIGH (ref 65–99)
Potassium: 3.6 mmol/L (ref 3.5–5.1)
Sodium: 138 mmol/L (ref 135–145)

## 2016-08-03 LAB — CBC
HCT: 36.8 % (ref 36.0–46.0)
Hemoglobin: 12.8 g/dL (ref 12.0–15.0)
MCH: 29.4 pg (ref 26.0–34.0)
MCHC: 34.8 g/dL (ref 30.0–36.0)
MCV: 84.6 fL (ref 78.0–100.0)
PLATELETS: 209 10*3/uL (ref 150–400)
RBC: 4.35 MIL/uL (ref 3.87–5.11)
RDW: 13 % (ref 11.5–15.5)
WBC: 10 10*3/uL (ref 4.0–10.5)

## 2016-08-03 LAB — D-DIMER, QUANTITATIVE (NOT AT ARMC): D DIMER QUANT: 0.37 ug{FEU}/mL (ref 0.00–0.50)

## 2016-08-03 MED ORDER — ACETAMINOPHEN 500 MG PO TABS: 500.0000 mg | ORAL_TABLET | Freq: Four times a day (QID) | ORAL | 0 refills | Status: AC | PRN

## 2016-08-03 MED ORDER — OMEPRAZOLE 20 MG PO CPDR: 20.0000 mg | DELAYED_RELEASE_CAPSULE | Freq: Every day | ORAL | 0 refills | Status: AC

## 2016-08-03 MED ORDER — ASPIRIN 81 MG PO CHEW
324.0000 mg | CHEWABLE_TABLET | Freq: Once | ORAL | Status: AC
  Administered 2016-08-03: 324 mg via ORAL
  Filled 2016-08-03: qty 4
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
## 2016-08-03 NOTE — ED Triage Notes (Signed)
Patient c/o right sided chest pain that has been going on couple days and been constant since yesterday. patient c/o SOB today with pain is what brought her to be evaluated.

## 2016-08-13 DIAGNOSIS — M940 Chondrocostal junction syndrome [Tietze]: Secondary | ICD-10-CM | POA: Diagnosis not present

## 2016-08-13 DIAGNOSIS — K219 Gastro-esophageal reflux disease without esophagitis: Secondary | ICD-10-CM | POA: Diagnosis not present

## 2016-08-21 DIAGNOSIS — R072 Precordial pain: Secondary | ICD-10-CM | POA: Diagnosis not present

## 2016-08-21 DIAGNOSIS — R030 Elevated blood-pressure reading, without diagnosis of hypertension: Secondary | ICD-10-CM | POA: Diagnosis not present

## 2016-08-21 DIAGNOSIS — E1165 Type 2 diabetes mellitus with hyperglycemia: Secondary | ICD-10-CM | POA: Diagnosis not present

## 2016-08-21 DIAGNOSIS — E119 Type 2 diabetes mellitus without complications: Secondary | ICD-10-CM | POA: Diagnosis not present

## 2016-12-09 DIAGNOSIS — M5416 Radiculopathy, lumbar region: Secondary | ICD-10-CM | POA: Diagnosis not present

## 2016-12-17 DIAGNOSIS — M545 Low back pain: Secondary | ICD-10-CM | POA: Diagnosis not present

## 2016-12-21 DIAGNOSIS — M5136 Other intervertebral disc degeneration, lumbar region: Secondary | ICD-10-CM | POA: Diagnosis not present

## 2017-01-12 DIAGNOSIS — M545 Low back pain: Secondary | ICD-10-CM | POA: Diagnosis not present

## 2017-01-19 DIAGNOSIS — M5416 Radiculopathy, lumbar region: Secondary | ICD-10-CM | POA: Diagnosis not present

## 2017-01-25 DIAGNOSIS — N760 Acute vaginitis: Secondary | ICD-10-CM | POA: Diagnosis not present

## 2017-01-25 DIAGNOSIS — N952 Postmenopausal atrophic vaginitis: Secondary | ICD-10-CM | POA: Diagnosis not present

## 2017-02-09 DIAGNOSIS — E119 Type 2 diabetes mellitus without complications: Secondary | ICD-10-CM | POA: Diagnosis not present

## 2017-02-12 DIAGNOSIS — M545 Low back pain: Secondary | ICD-10-CM | POA: Diagnosis not present

## 2017-02-16 DIAGNOSIS — M545 Low back pain: Secondary | ICD-10-CM | POA: Diagnosis not present

## 2017-02-17 DIAGNOSIS — E1165 Type 2 diabetes mellitus with hyperglycemia: Secondary | ICD-10-CM | POA: Diagnosis not present

## 2017-02-18 DIAGNOSIS — M545 Low back pain: Secondary | ICD-10-CM | POA: Diagnosis not present

## 2017-02-22 DIAGNOSIS — M545 Low back pain: Secondary | ICD-10-CM | POA: Diagnosis not present

## 2017-02-24 ENCOUNTER — Other Ambulatory Visit: Payer: Self-pay | Admitting: Internal Medicine

## 2017-02-24 DIAGNOSIS — Z0000 Encounter for general adult medical examination without abnormal findings: Secondary | ICD-10-CM | POA: Diagnosis not present

## 2017-02-24 DIAGNOSIS — E119 Type 2 diabetes mellitus without complications: Secondary | ICD-10-CM | POA: Diagnosis not present

## 2017-02-24 DIAGNOSIS — I519 Heart disease, unspecified: Secondary | ICD-10-CM | POA: Diagnosis not present

## 2017-02-24 DIAGNOSIS — E7800 Pure hypercholesterolemia, unspecified: Secondary | ICD-10-CM | POA: Diagnosis not present

## 2017-02-24 DIAGNOSIS — Z1231 Encounter for screening mammogram for malignant neoplasm of breast: Secondary | ICD-10-CM

## 2017-03-01 DIAGNOSIS — M545 Low back pain: Secondary | ICD-10-CM | POA: Diagnosis not present

## 2017-03-04 DIAGNOSIS — M545 Low back pain: Secondary | ICD-10-CM | POA: Diagnosis not present

## 2017-03-08 DIAGNOSIS — M545 Low back pain: Secondary | ICD-10-CM | POA: Diagnosis not present

## 2017-03-11 DIAGNOSIS — M545 Low back pain: Secondary | ICD-10-CM | POA: Diagnosis not present

## 2017-03-23 DIAGNOSIS — M545 Low back pain: Secondary | ICD-10-CM | POA: Diagnosis not present

## 2017-03-24 ENCOUNTER — Ambulatory Visit: Payer: 59

## 2017-03-25 ENCOUNTER — Ambulatory Visit
Admission: RE | Admit: 2017-03-25 | Discharge: 2017-03-25 | Disposition: A | Discharge status: Home / Self Care | Payer: 59 | Source: Ambulatory Visit | Attending: Internal Medicine | Admitting: Internal Medicine

## 2017-03-25 DIAGNOSIS — Z1231 Encounter for screening mammogram for malignant neoplasm of breast: Secondary | ICD-10-CM | POA: Diagnosis not present

## 2017-05-10 DIAGNOSIS — E1165 Type 2 diabetes mellitus with hyperglycemia: Secondary | ICD-10-CM | POA: Diagnosis not present

## 2017-05-10 DIAGNOSIS — E119 Type 2 diabetes mellitus without complications: Secondary | ICD-10-CM | POA: Diagnosis not present

## 2017-05-31 DIAGNOSIS — E1165 Type 2 diabetes mellitus with hyperglycemia: Secondary | ICD-10-CM | POA: Diagnosis not present

## 2017-08-04 DIAGNOSIS — S92505A Nondisplaced unspecified fracture of left lesser toe(s), initial encounter for closed fracture: Secondary | ICD-10-CM | POA: Diagnosis not present

## 2017-08-16 DIAGNOSIS — E119 Type 2 diabetes mellitus without complications: Secondary | ICD-10-CM | POA: Diagnosis not present

## 2017-08-16 DIAGNOSIS — E7800 Pure hypercholesterolemia, unspecified: Secondary | ICD-10-CM | POA: Diagnosis not present

## 2017-08-27 IMAGING — DX DG ANKLE COMPLETE 3+V*L*
3 series · 3 of 3 positions shown · non-contrast
Comparison: None.

CLINICAL DATA: Foley chair closed on foot and ankle with medial
pain and swelling, initial encounter

EXAM:
LEFT ANKLE COMPLETE - 3+ VIEW

[ankle ap]
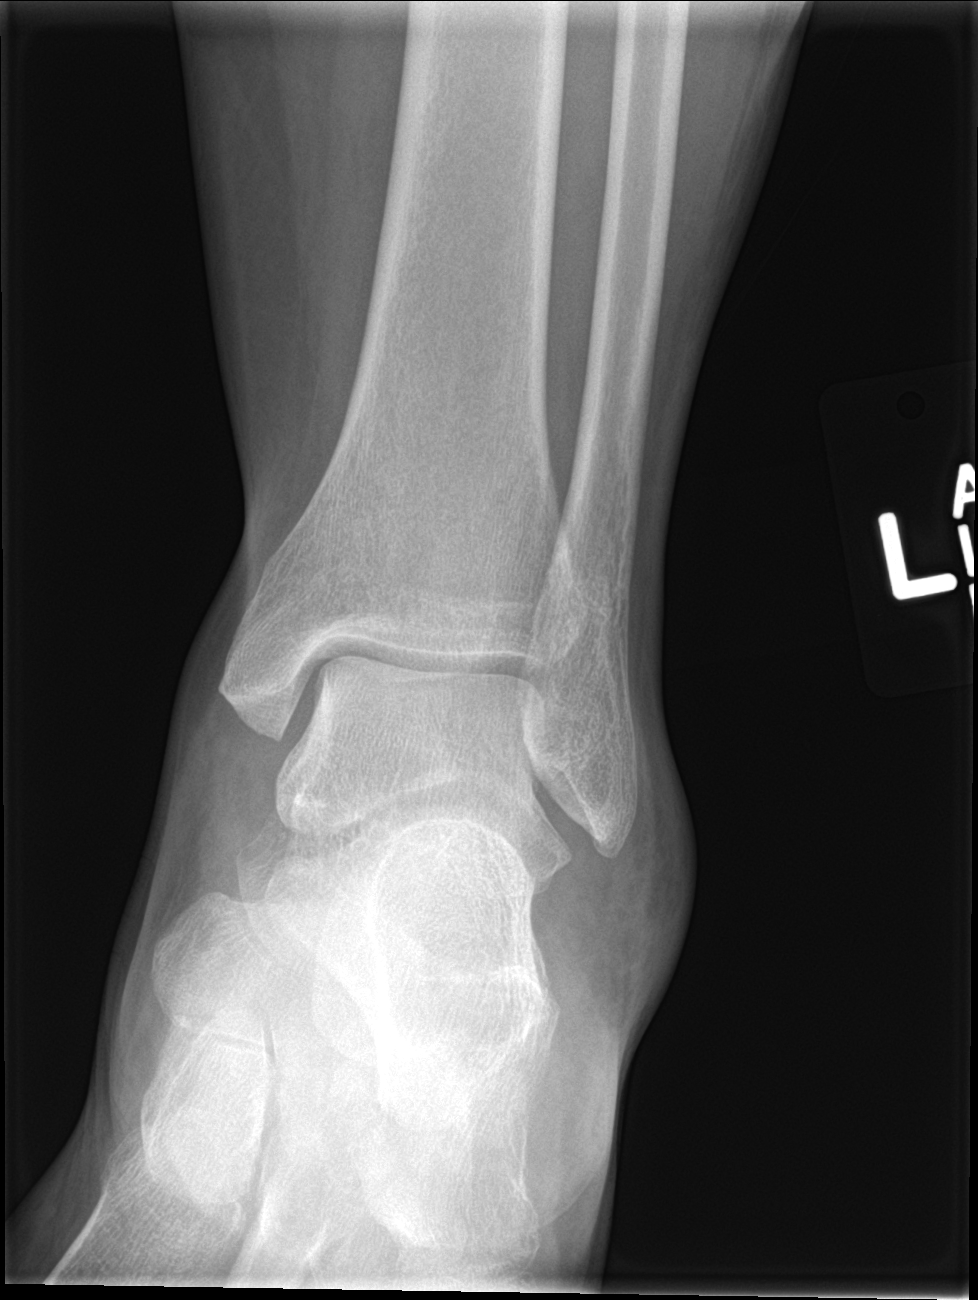

[ankle obl]
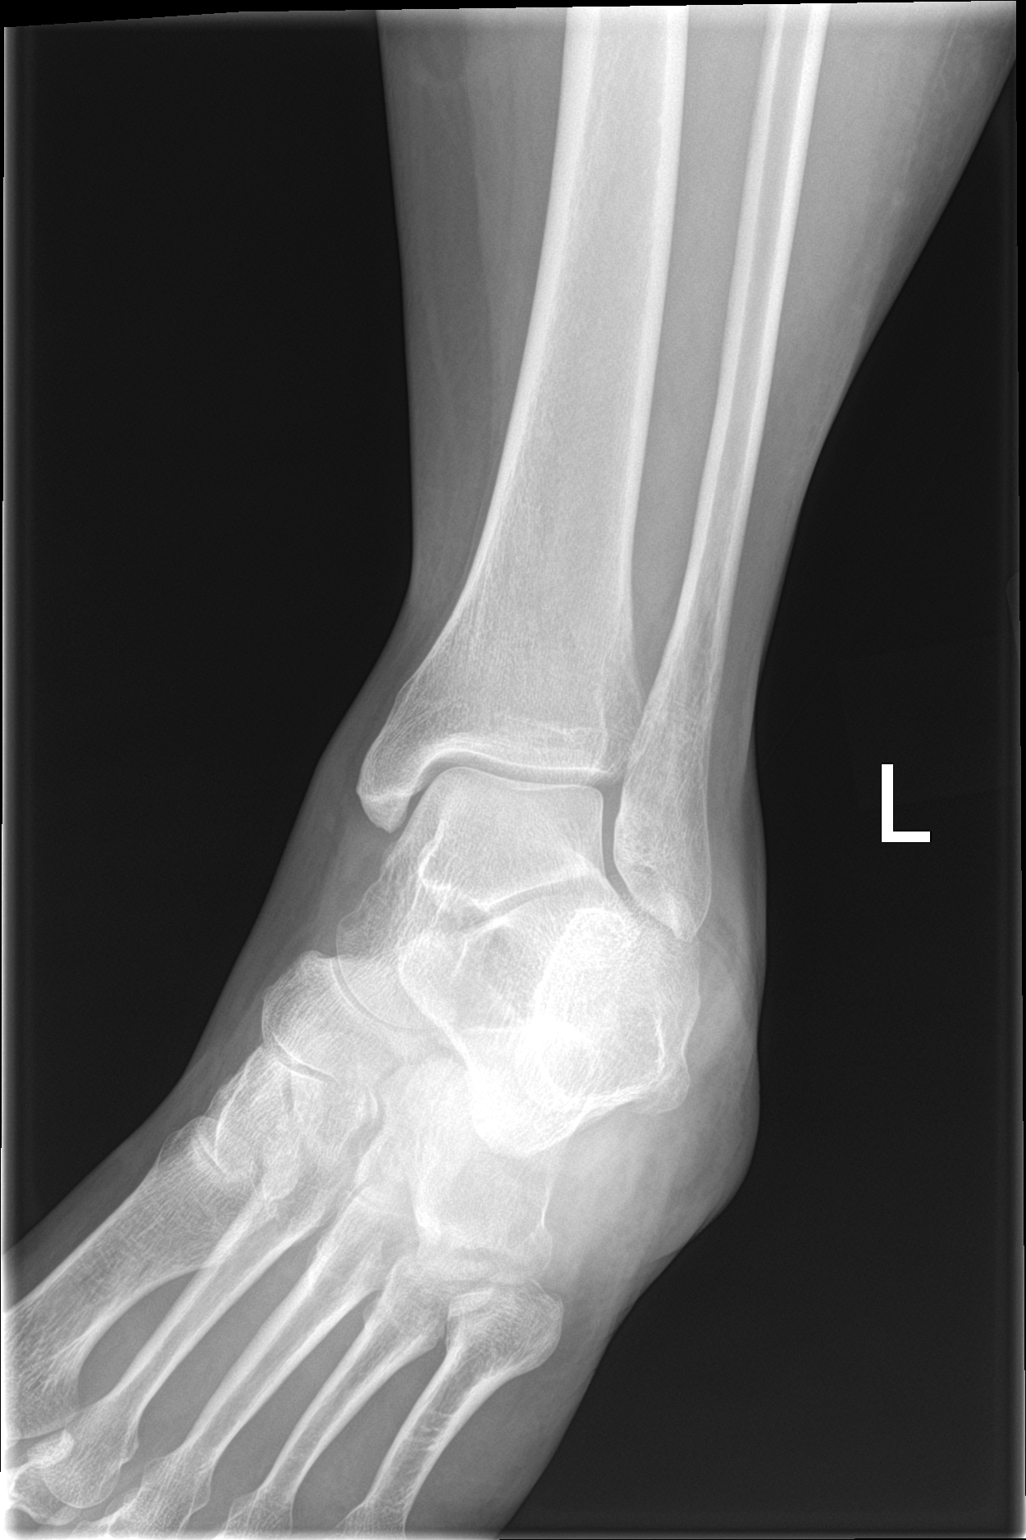

[ankle lat]
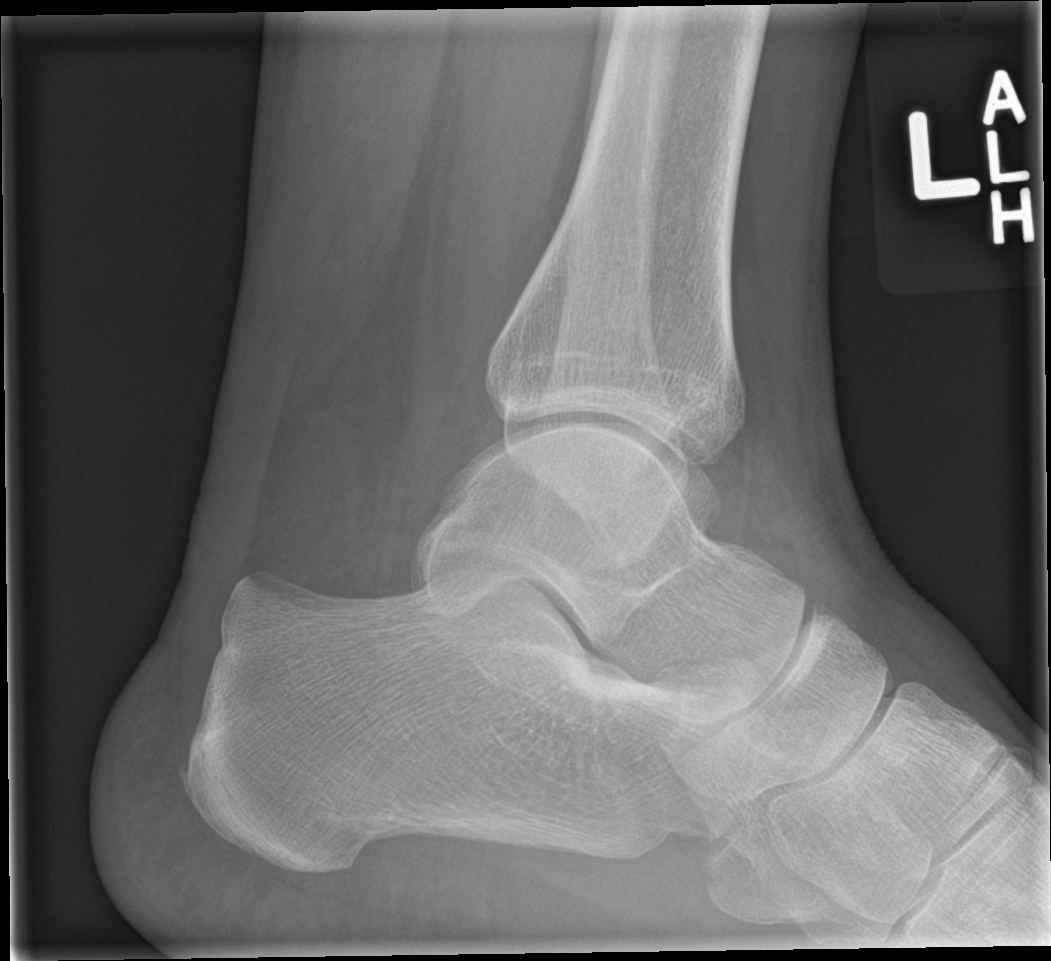

[3 of 3 positions shown; findings below may reference images not displayed]

FINDINGS: Ankle swelling is both medially and laterally. No acute fracture or
dislocation is noted.
IMPRESSION: Soft tissue swelling without acute bony abnormality.

## 2017-08-29 DIAGNOSIS — R079 Chest pain, unspecified: Secondary | ICD-10-CM | POA: Diagnosis not present

## 2017-08-29 DIAGNOSIS — R9431 Abnormal electrocardiogram [ECG] [EKG]: Secondary | ICD-10-CM | POA: Diagnosis not present

## 2017-09-23 ENCOUNTER — Other Ambulatory Visit: Payer: Self-pay | Admitting: Internal Medicine

## 2017-09-23 ENCOUNTER — Ambulatory Visit
Admission: RE | Admit: 2017-09-23 | Discharge: 2017-09-23 | Disposition: A | Discharge status: Home / Self Care | Payer: 59 | Source: Ambulatory Visit | Attending: Internal Medicine | Admitting: Internal Medicine

## 2017-09-23 DIAGNOSIS — R0789 Other chest pain: Secondary | ICD-10-CM

## 2017-09-23 DIAGNOSIS — I10 Essential (primary) hypertension: Secondary | ICD-10-CM | POA: Diagnosis not present

## 2017-10-12 DIAGNOSIS — K210 Gastro-esophageal reflux disease with esophagitis: Secondary | ICD-10-CM | POA: Diagnosis not present

## 2017-10-12 DIAGNOSIS — R0782 Intercostal pain: Secondary | ICD-10-CM | POA: Diagnosis not present

## 2017-10-12 DIAGNOSIS — R1011 Right upper quadrant pain: Secondary | ICD-10-CM | POA: Diagnosis not present

## 2017-10-12 DIAGNOSIS — E785 Hyperlipidemia, unspecified: Secondary | ICD-10-CM | POA: Diagnosis not present

## 2017-10-18 DIAGNOSIS — M549 Dorsalgia, unspecified: Secondary | ICD-10-CM | POA: Diagnosis not present

## 2017-10-22 DIAGNOSIS — M546 Pain in thoracic spine: Secondary | ICD-10-CM | POA: Diagnosis not present

## 2017-10-29 DIAGNOSIS — M549 Dorsalgia, unspecified: Secondary | ICD-10-CM | POA: Diagnosis not present

## 2017-11-08 DIAGNOSIS — I10 Essential (primary) hypertension: Secondary | ICD-10-CM | POA: Diagnosis not present

## 2017-11-08 DIAGNOSIS — E7800 Pure hypercholesterolemia, unspecified: Secondary | ICD-10-CM | POA: Diagnosis not present

## 2017-11-08 DIAGNOSIS — E1169 Type 2 diabetes mellitus with other specified complication: Secondary | ICD-10-CM | POA: Diagnosis not present

## 2017-12-29 DIAGNOSIS — M67911 Unspecified disorder of synovium and tendon, right shoulder: Secondary | ICD-10-CM | POA: Diagnosis not present

## 2017-12-29 DIAGNOSIS — M25521 Pain in right elbow: Secondary | ICD-10-CM | POA: Diagnosis not present

## 2017-12-29 DIAGNOSIS — M4722 Other spondylosis with radiculopathy, cervical region: Secondary | ICD-10-CM | POA: Diagnosis not present

## 2017-12-30 DIAGNOSIS — M62838 Other muscle spasm: Secondary | ICD-10-CM | POA: Diagnosis not present

## 2017-12-30 DIAGNOSIS — M7541 Impingement syndrome of right shoulder: Secondary | ICD-10-CM | POA: Diagnosis not present

## 2017-12-30 DIAGNOSIS — S46001A Unspecified injury of muscle(s) and tendon(s) of the rotator cuff of right shoulder, initial encounter: Secondary | ICD-10-CM | POA: Diagnosis not present

## 2017-12-31 DIAGNOSIS — M62838 Other muscle spasm: Secondary | ICD-10-CM | POA: Diagnosis not present

## 2017-12-31 DIAGNOSIS — S46001A Unspecified injury of muscle(s) and tendon(s) of the rotator cuff of right shoulder, initial encounter: Secondary | ICD-10-CM | POA: Diagnosis not present

## 2017-12-31 DIAGNOSIS — M7541 Impingement syndrome of right shoulder: Secondary | ICD-10-CM | POA: Diagnosis not present

## 2018-01-04 DIAGNOSIS — M62838 Other muscle spasm: Secondary | ICD-10-CM | POA: Diagnosis not present

## 2018-01-04 DIAGNOSIS — M7541 Impingement syndrome of right shoulder: Secondary | ICD-10-CM | POA: Diagnosis not present

## 2018-01-04 DIAGNOSIS — S46001A Unspecified injury of muscle(s) and tendon(s) of the rotator cuff of right shoulder, initial encounter: Secondary | ICD-10-CM | POA: Diagnosis not present

## 2018-01-06 DIAGNOSIS — S46001A Unspecified injury of muscle(s) and tendon(s) of the rotator cuff of right shoulder, initial encounter: Secondary | ICD-10-CM | POA: Diagnosis not present

## 2018-01-06 DIAGNOSIS — M7541 Impingement syndrome of right shoulder: Secondary | ICD-10-CM | POA: Diagnosis not present

## 2018-01-06 DIAGNOSIS — M62838 Other muscle spasm: Secondary | ICD-10-CM | POA: Diagnosis not present

## 2018-01-11 DIAGNOSIS — S46001A Unspecified injury of muscle(s) and tendon(s) of the rotator cuff of right shoulder, initial encounter: Secondary | ICD-10-CM | POA: Diagnosis not present

## 2018-01-11 DIAGNOSIS — M7541 Impingement syndrome of right shoulder: Secondary | ICD-10-CM | POA: Diagnosis not present

## 2018-01-11 DIAGNOSIS — M62838 Other muscle spasm: Secondary | ICD-10-CM | POA: Diagnosis not present

## 2018-01-13 DIAGNOSIS — M7541 Impingement syndrome of right shoulder: Secondary | ICD-10-CM | POA: Diagnosis not present

## 2018-01-13 DIAGNOSIS — M62838 Other muscle spasm: Secondary | ICD-10-CM | POA: Diagnosis not present

## 2018-01-13 DIAGNOSIS — S46001A Unspecified injury of muscle(s) and tendon(s) of the rotator cuff of right shoulder, initial encounter: Secondary | ICD-10-CM | POA: Diagnosis not present

## 2018-01-14 DIAGNOSIS — M62838 Other muscle spasm: Secondary | ICD-10-CM | POA: Diagnosis not present

## 2018-01-14 DIAGNOSIS — M7541 Impingement syndrome of right shoulder: Secondary | ICD-10-CM | POA: Diagnosis not present

## 2018-01-14 DIAGNOSIS — S46001A Unspecified injury of muscle(s) and tendon(s) of the rotator cuff of right shoulder, initial encounter: Secondary | ICD-10-CM | POA: Diagnosis not present

## 2018-01-18 DIAGNOSIS — M7541 Impingement syndrome of right shoulder: Secondary | ICD-10-CM | POA: Diagnosis not present

## 2018-01-18 DIAGNOSIS — M62838 Other muscle spasm: Secondary | ICD-10-CM | POA: Diagnosis not present

## 2018-01-18 DIAGNOSIS — S46001A Unspecified injury of muscle(s) and tendon(s) of the rotator cuff of right shoulder, initial encounter: Secondary | ICD-10-CM | POA: Diagnosis not present

## 2018-01-20 DIAGNOSIS — M7541 Impingement syndrome of right shoulder: Secondary | ICD-10-CM | POA: Diagnosis not present

## 2018-01-20 DIAGNOSIS — M62838 Other muscle spasm: Secondary | ICD-10-CM | POA: Diagnosis not present

## 2018-01-20 DIAGNOSIS — S46001A Unspecified injury of muscle(s) and tendon(s) of the rotator cuff of right shoulder, initial encounter: Secondary | ICD-10-CM | POA: Diagnosis not present

## 2018-01-21 DIAGNOSIS — M7541 Impingement syndrome of right shoulder: Secondary | ICD-10-CM | POA: Diagnosis not present

## 2018-01-21 DIAGNOSIS — S46001A Unspecified injury of muscle(s) and tendon(s) of the rotator cuff of right shoulder, initial encounter: Secondary | ICD-10-CM | POA: Diagnosis not present

## 2018-01-21 DIAGNOSIS — M62838 Other muscle spasm: Secondary | ICD-10-CM | POA: Diagnosis not present

## 2018-01-27 DIAGNOSIS — M7541 Impingement syndrome of right shoulder: Secondary | ICD-10-CM | POA: Diagnosis not present

## 2018-01-27 DIAGNOSIS — M62838 Other muscle spasm: Secondary | ICD-10-CM | POA: Diagnosis not present

## 2018-01-27 DIAGNOSIS — S46001A Unspecified injury of muscle(s) and tendon(s) of the rotator cuff of right shoulder, initial encounter: Secondary | ICD-10-CM | POA: Diagnosis not present

## 2018-01-28 DIAGNOSIS — M7541 Impingement syndrome of right shoulder: Secondary | ICD-10-CM | POA: Diagnosis not present

## 2018-01-28 DIAGNOSIS — M62838 Other muscle spasm: Secondary | ICD-10-CM | POA: Diagnosis not present

## 2018-01-28 DIAGNOSIS — S46001A Unspecified injury of muscle(s) and tendon(s) of the rotator cuff of right shoulder, initial encounter: Secondary | ICD-10-CM | POA: Diagnosis not present

## 2018-03-07 DIAGNOSIS — R102 Pelvic and perineal pain: Secondary | ICD-10-CM | POA: Diagnosis not present

## 2018-03-07 DIAGNOSIS — L309 Dermatitis, unspecified: Secondary | ICD-10-CM | POA: Diagnosis not present

## 2018-03-24 DIAGNOSIS — Z23 Encounter for immunization: Secondary | ICD-10-CM | POA: Diagnosis not present

## 2018-03-24 DIAGNOSIS — E1169 Type 2 diabetes mellitus with other specified complication: Secondary | ICD-10-CM | POA: Diagnosis not present

## 2018-03-24 DIAGNOSIS — Z0000 Encounter for general adult medical examination without abnormal findings: Secondary | ICD-10-CM | POA: Diagnosis not present

## 2018-03-24 DIAGNOSIS — I10 Essential (primary) hypertension: Secondary | ICD-10-CM | POA: Diagnosis not present

## 2018-03-24 DIAGNOSIS — E7800 Pure hypercholesterolemia, unspecified: Secondary | ICD-10-CM | POA: Diagnosis not present

## 2018-03-25 DIAGNOSIS — N762 Acute vulvitis: Secondary | ICD-10-CM | POA: Diagnosis not present

## 2018-03-31 DIAGNOSIS — E1165 Type 2 diabetes mellitus with hyperglycemia: Secondary | ICD-10-CM | POA: Diagnosis not present

## 2018-03-31 DIAGNOSIS — E1169 Type 2 diabetes mellitus with other specified complication: Secondary | ICD-10-CM | POA: Diagnosis not present

## 2018-04-01 ENCOUNTER — Inpatient Hospital Stay (HOSPITAL_COMMUNITY)
Admission: AD | Admit: 2018-04-01 | Discharge: 2018-04-01 | Discharge status: Against Medical Advice | Payer: 59 | Attending: Obstetrics & Gynecology | Admitting: Obstetrics & Gynecology

## 2018-04-01 DIAGNOSIS — Z5329 Procedure and treatment not carried out because of patient's decision for other reasons: Secondary | ICD-10-CM | POA: Insufficient documentation

## 2018-04-01 DIAGNOSIS — N9489 Other specified conditions associated with female genital organs and menstrual cycle: Secondary | ICD-10-CM | POA: Diagnosis not present

## 2018-04-01 NOTE — MAU Note (Signed)
Pt reports bumps on perineum, and around anus.

## 2018-04-11 DIAGNOSIS — E1165 Type 2 diabetes mellitus with hyperglycemia: Secondary | ICD-10-CM | POA: Diagnosis not present

## 2018-04-11 DIAGNOSIS — E119 Type 2 diabetes mellitus without complications: Secondary | ICD-10-CM | POA: Diagnosis not present

## 2018-04-11 DIAGNOSIS — N762 Acute vulvitis: Secondary | ICD-10-CM | POA: Diagnosis not present

## 2018-05-08 IMAGING — CR DG CHEST 2V
2 series · 2 of 2 positions shown · non-contrast
Comparison: Chest radiograph 06/27/2011

CLINICAL DATA: Patient with right mid chest pain. Shortness of
breath.

EXAM:
CHEST  2 VIEW

[w chest pa]
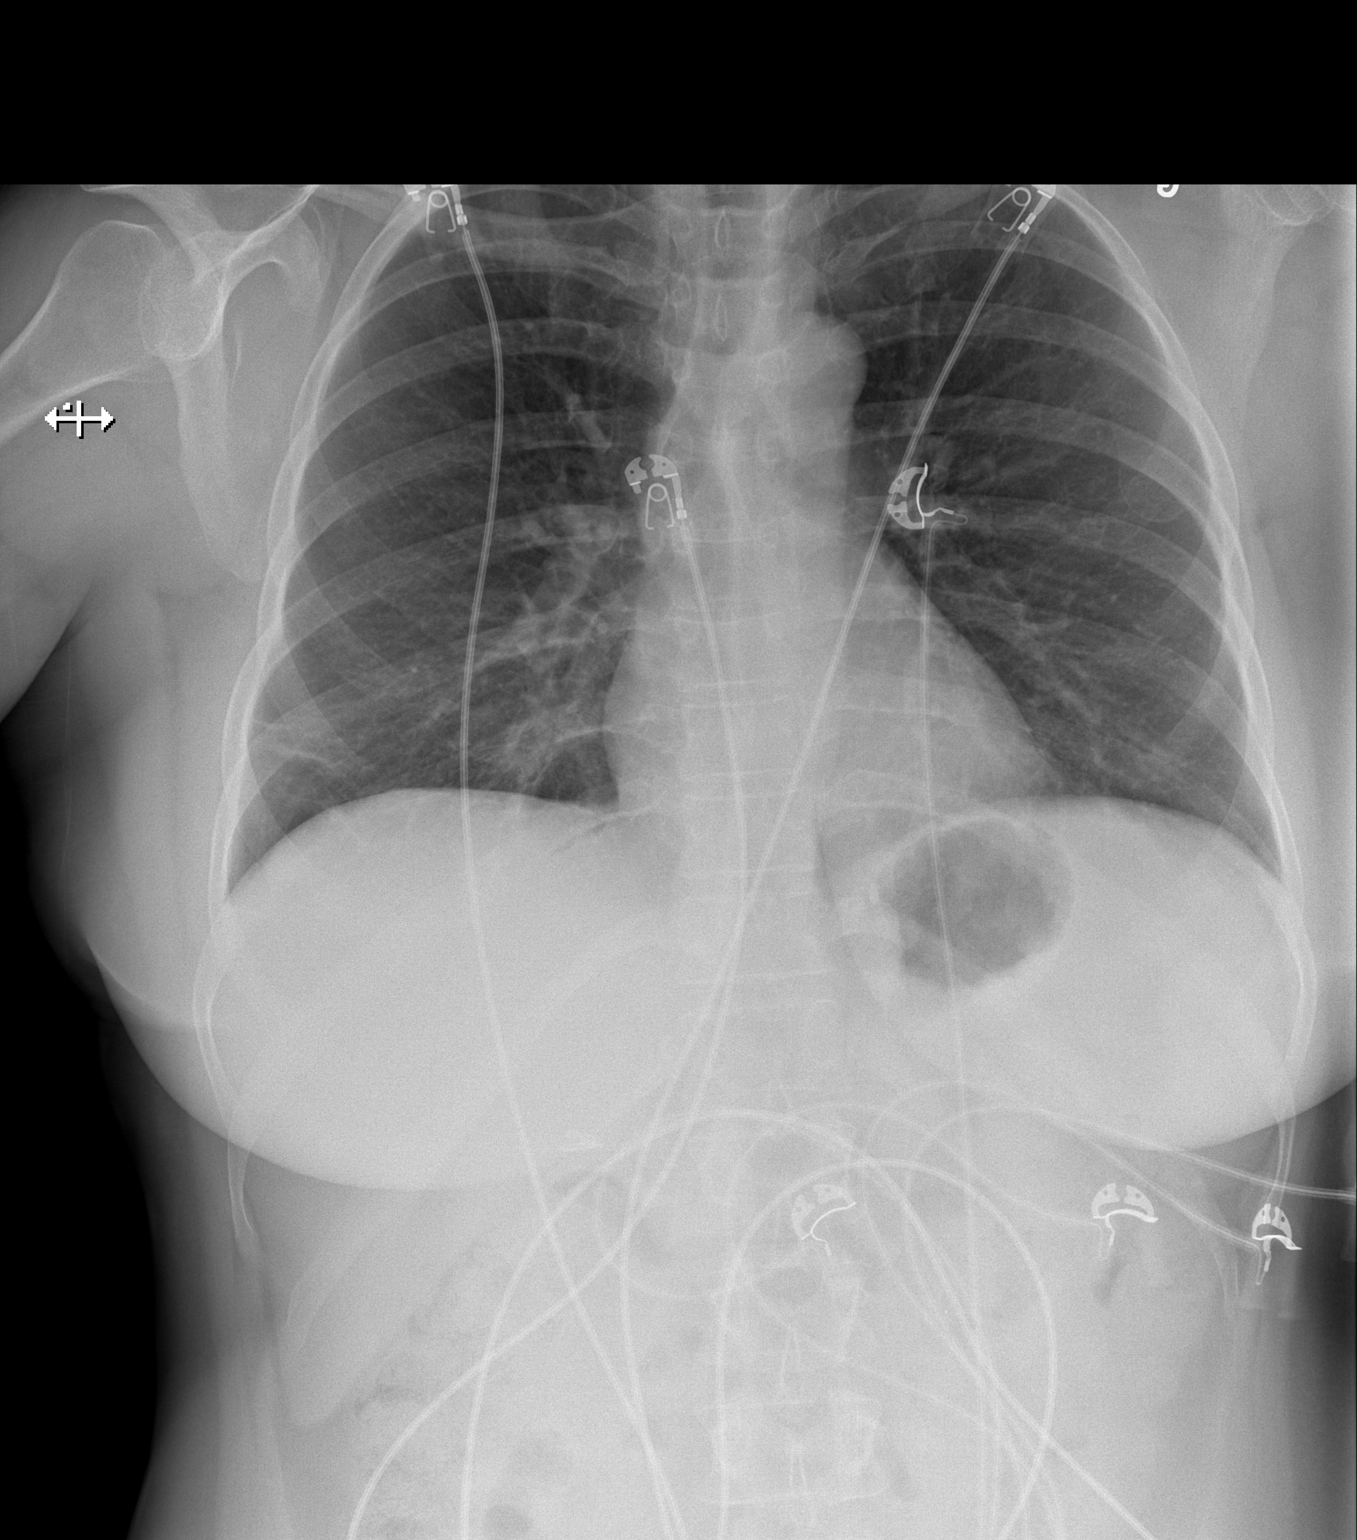

[w chest lat]
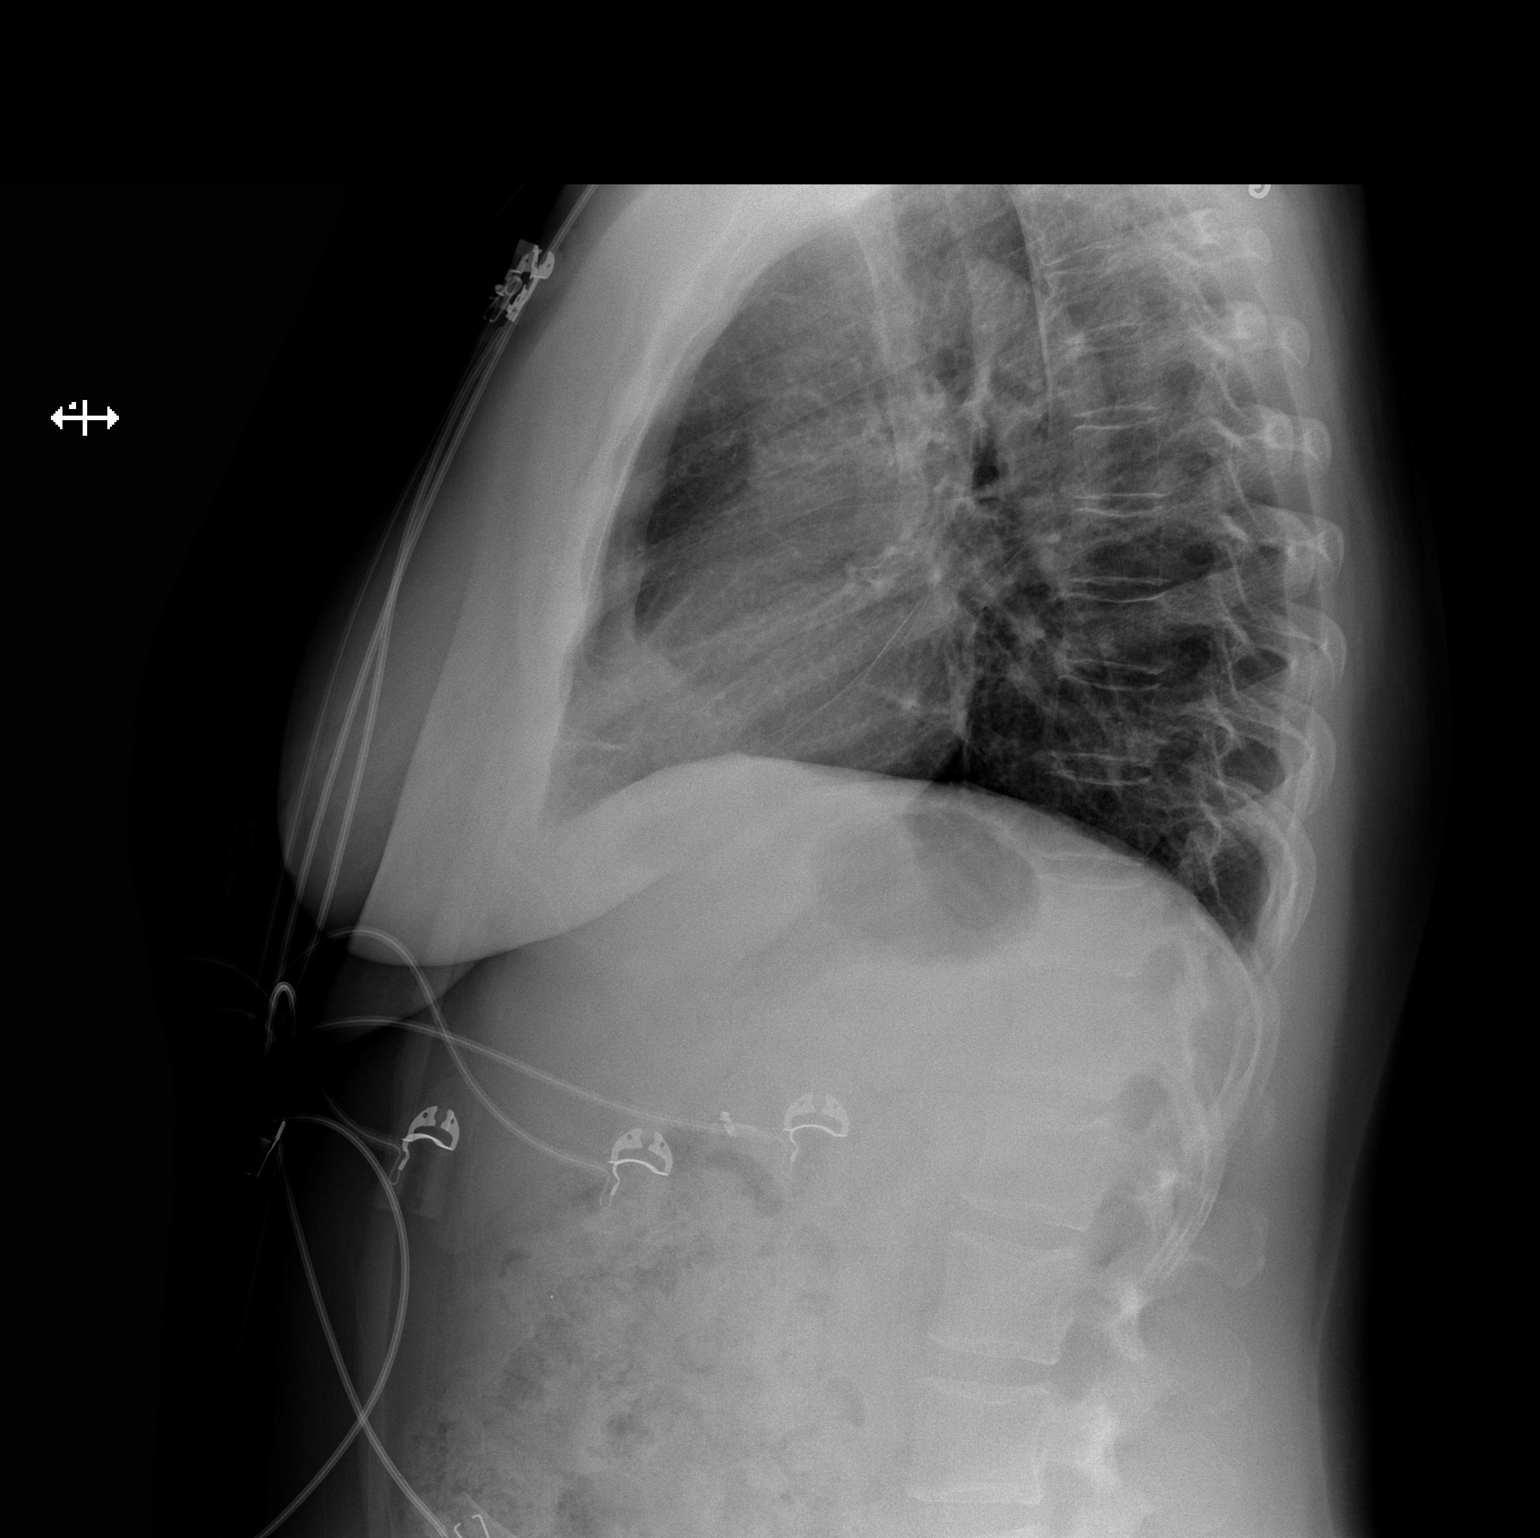

[2 of 2 positions shown; findings below may reference images not displayed]

FINDINGS: Monitoring leads overlie the patient. Stable cardiac and mediastinal
contours. Minimal atelectasis and/or scarring right lung base. No
large area of pulmonary consolidation. No pleural effusion or
pneumothorax. Regional skeleton is unremarkable.
IMPRESSION: No acute cardiopulmonary process.

## 2018-05-09 ENCOUNTER — Other Ambulatory Visit: Payer: Self-pay | Admitting: Internal Medicine

## 2018-05-09 DIAGNOSIS — Z1231 Encounter for screening mammogram for malignant neoplasm of breast: Secondary | ICD-10-CM

## 2018-05-20 DIAGNOSIS — R079 Chest pain, unspecified: Secondary | ICD-10-CM | POA: Diagnosis not present

## 2018-06-02 ENCOUNTER — Ambulatory Visit: Payer: 59

## 2018-06-14 DIAGNOSIS — R5383 Other fatigue: Secondary | ICD-10-CM | POA: Diagnosis not present

## 2018-06-14 DIAGNOSIS — R072 Precordial pain: Secondary | ICD-10-CM | POA: Diagnosis not present

## 2018-06-21 ENCOUNTER — Encounter (HOSPITAL_COMMUNITY): Payer: Self-pay | Admitting: Emergency Medicine

## 2018-06-21 ENCOUNTER — Emergency Department (HOSPITAL_COMMUNITY): Payer: 59

## 2018-06-21 ENCOUNTER — Other Ambulatory Visit: Payer: Self-pay

## 2018-06-21 ENCOUNTER — Emergency Department (HOSPITAL_COMMUNITY)
Admission: EM | Admit: 2018-06-21 | Discharge: 2018-06-21 | Disposition: A | Discharge status: Home / Self Care | Payer: 59 | Attending: Emergency Medicine | Admitting: Emergency Medicine

## 2018-06-21 DIAGNOSIS — Z79899 Other long term (current) drug therapy: Secondary | ICD-10-CM | POA: Insufficient documentation

## 2018-06-21 DIAGNOSIS — I10 Essential (primary) hypertension: Secondary | ICD-10-CM | POA: Diagnosis not present

## 2018-06-21 DIAGNOSIS — R0789 Other chest pain: Secondary | ICD-10-CM | POA: Insufficient documentation

## 2018-06-21 DIAGNOSIS — R05 Cough: Secondary | ICD-10-CM | POA: Diagnosis not present

## 2018-06-21 DIAGNOSIS — E119 Type 2 diabetes mellitus without complications: Secondary | ICD-10-CM | POA: Insufficient documentation

## 2018-06-21 DIAGNOSIS — R0602 Shortness of breath: Secondary | ICD-10-CM | POA: Insufficient documentation

## 2018-06-21 DIAGNOSIS — R079 Chest pain, unspecified: Secondary | ICD-10-CM | POA: Diagnosis not present

## 2018-06-21 HISTORY — DX: Hyperlipidemia, unspecified: E78.5

## 2018-06-21 LAB — CBC
HCT: 37.8 % (ref 36.0–46.0)
HEMOGLOBIN: 12.9 g/dL (ref 12.0–15.0)
MCH: 29.3 pg (ref 26.0–34.0)
MCHC: 34.1 g/dL (ref 30.0–36.0)
MCV: 85.9 fL (ref 80.0–100.0)
Platelets: 202 10*3/uL (ref 150–400)
RBC: 4.4 MIL/uL (ref 3.87–5.11)
RDW: 12.8 % (ref 11.5–15.5)
WBC: 7.8 10*3/uL (ref 4.0–10.5)
nRBC: 0 % (ref 0.0–0.2)

## 2018-06-21 LAB — BASIC METABOLIC PANEL
ANION GAP: 10 (ref 5–15)
BUN: 16 mg/dL (ref 6–20)
CALCIUM: 9.6 mg/dL (ref 8.9–10.3)
CO2: 25 mmol/L (ref 22–32)
Chloride: 105 mmol/L (ref 98–111)
Creatinine, Ser: 0.84 mg/dL (ref 0.44–1.00)
GFR calc Af Amer: >60 mL/min (ref >60)
GFR calc non Af Amer: >60 mL/min (ref >60)
Glucose, Bld: 162 mg/dL — ABNORMAL HIGH (ref 70–99)
POTASSIUM: 3.5 mmol/L (ref 3.5–5.1)
Sodium: 140 mmol/L (ref 135–145)

## 2018-06-21 LAB — D-DIMER, QUANTITATIVE (NOT AT ARMC): D DIMER QUANT: 0.28 ug{FEU}/mL (ref 0.00–0.50)

## 2018-06-21 LAB — I-STAT BETA HCG BLOOD, ED (MC, WL, AP ONLY)

## 2018-06-21 LAB — I-STAT TROPONIN, ED
TROPONIN I, POC: 0 ng/mL (ref 0.00–0.08)
Troponin i, poc: 0 ng/mL (ref 0.00–0.08)

## 2018-06-21 MED ORDER — ASPIRIN 81 MG PO CHEW
324.0000 mg | CHEWABLE_TABLET | Freq: Once | ORAL | Status: AC
  Administered 2018-06-21: 324 mg via ORAL
  Filled 2018-06-21: qty 4

## 2018-06-21 MED ORDER — SODIUM CHLORIDE 0.9% FLUSH
3.0000 mL | Freq: Once | INTRAVENOUS | Status: AC
  Administered 2018-06-21: 3 mL via INTRAVENOUS

## 2018-06-21 NOTE — ED Provider Notes (Signed)
[facility] DEPT Provider Note   CSN: [id number] Arrival date & time: 06/21/18  1032    History   Chief Complaint Chief Complaint  Patient presents with  . Chest Pain    HPI Melanie Lane is a 53 y.o. female.     The history is provided by the patient.  Chest Pain  Pain location:  L chest Pain quality: pressure and throbbing   Pain radiates to:  Upper back Pain severity:  Moderate Onset quality:  Sudden Duration:  20 minutes Timing:  Intermittent Progression:  Partially resolved Chronicity:  Recurrent Context: at rest   Relieved by:  Nothing Worsened by:  Nothing Ineffective treatments:  Antacids (started nexium last week) Associated symptoms: cough and shortness of breath   Associated symptoms: no abdominal pain, no back pain, no fever, no lower extremity edema, no nausea, no vomiting and no weakness   Associated symptoms comment:  Cough that is been constant since the chest pain.  She followed up with Dr. Laurann Montana last week and he thought it might be acid reflux.  She has been on the Nexium since then but the pain has not resolved.  She gets pain daily but today's pain was more pressure than what she is used to.  It usually takes hours to go away.  She has no family history of cardiac disease.  She does not use tobacco, alcohol or drugs.  She is scheduled to see Dr. Alvester Chou tomorrow in the office. Risk factors: diabetes mellitus and hypertension   Risk factors: no coronary artery disease, not obese, no prior DVT/PE and no smoking     Past Medical History:  Diagnosis Date  . Diabetes mellitus    type 2-recent diagnosis-exercise and diet controlled  . Headache(784.0)   . Hyperlipidemia   . Joint swelling     Patient Active Problem List   Diagnosis Date Noted  . Syncope 01/27/2016    Past Surgical History:  Procedure Laterality Date  . ABDOMINAL HYSTERECTOMY  07/16/2011   Procedure: HYSTERECTOMY ABDOMINAL;  Surgeon: Maeola Sarah. Landry Mellow,  MD;  Location: [facility];  Service: Gynecology;  Laterality: N/A;  . APPENDECTOMY    . CHOLECYSTECTOMY    . DILATION AND CURETTAGE OF UTERUS    . MOUTH SURGERY       OB History   No obstetric history on file.      Home Medications    Prior to Admission medications   Medication Sig Start Date End Date Taking? Authorizing Provider  acetaminophen (TYLENOL) 500 MG tablet Take 1 tablet (500 mg total) by mouth every 6 (six) hours as needed for mild pain or moderate pain. 08/03/16   Waynetta Pean, PA-C  omeprazole (PRILOSEC) 20 MG capsule Take 1 capsule (20 mg total) by mouth daily. 08/03/16   Waynetta Pean, PA-C    Family History No family history on file.  Social History Social History   Tobacco Use  . Smoking status: Never Smoker  . Smokeless tobacco: Never Used  Substance Use Topics  . Alcohol use: No  . Drug use: No     Allergies   Benadryl [diphenhydramine hcl] and Codeine   Review of Systems Review of Systems  Constitutional: Negative for fever.  Respiratory: Positive for cough and shortness of breath.   Cardiovascular: Positive for chest pain.  Gastrointestinal: Negative for abdominal pain, nausea and vomiting.  Musculoskeletal: Negative for back pain.  Neurological: Negative for weakness.  All other systems reviewed and are negative.  Physical Exam Updated Vital Signs BP (!) 178/106 (BP Location: Left Arm)   Pulse 77   Temp 97.9 F (36.6 C) (Oral)   Resp 14   Ht 5\' 1"  (1.549 m)   Wt 74.4 kg   LMP 06/24/2011   SpO2 100%   BMI 30.99 kg/m   Physical Exam Vitals signs and nursing note reviewed.  Constitutional:      General: She is in acute distress.     Appearance: She is well-developed.  HENT:     Head: Normocephalic and atraumatic.  Eyes:     Pupils: Pupils are equal, round, and reactive to light.  Cardiovascular:     Rate and Rhythm: Normal rate and regular rhythm.     Pulses: Normal pulses.     Heart sounds: Normal heart sounds. No  murmur. No friction rub.     Comments: Pulses are equal in bilateral upper extremities Pulmonary:     Effort: Pulmonary effort is normal.     Breath sounds: Normal breath sounds. No wheezing or rales.  Chest:     Chest wall: No tenderness.  Abdominal:     General: Bowel sounds are normal. There is no distension.     Palpations: Abdomen is soft.     Tenderness: There is no abdominal tenderness. There is no guarding or rebound.  Musculoskeletal: Normal range of motion.        General: No tenderness.     Comments: No edema  Skin:    General: Skin is warm and dry.     Findings: No rash.  Neurological:     Mental Status: She is alert and oriented to person, place, and time.     Cranial Nerves: No cranial nerve deficit.  Psychiatric:        Mood and Affect: Mood normal.        Behavior: Behavior normal.        Thought Content: Thought content normal.      ED Treatments / Results  Labs (all labs ordered are listed, but only abnormal results are displayed) Labs Reviewed  BASIC METABOLIC PANEL - Abnormal; Notable for the following components:      Result Value   Glucose, Bld 162 (*)    All other components within normal limits  CBC  D-DIMER, QUANTITATIVE (NOT AT [facility])  I-STAT TROPONIN, ED  I-STAT BETA HCG BLOOD, ED ([facility], [facility], [facility] ONLY)  I-STAT TROPONIN, ED    EKG EKG Interpretation  Date/Time:  Tuesday June 21 2018 10:38:42 EDT Ventricular Rate:  80 PR Interval:    QRS Duration: 89 QT Interval:  363 QTC Calculation: 419 R Axis:   48 Text Interpretation:  Sinus rhythm Borderline abnrm T, anterolateral leads No significant change since last tracing Confirmed by Blanchie Dessert [phone]) on 06/21/2018 10:52:00 AM   Radiology No results found.  Procedures Procedures (including critical care time)  Medications Ordered in ED Medications  aspirin chewable tablet 324 mg (has no administration in time range)  sodium chloride flush (NS) 0.9 % injection 3 mL (3 mLs  Intravenous Given 06/21/18 1054)     Initial Impression / Assessment and Plan / ED Course  I have reviewed the triage vital signs and the nursing notes.  Pertinent labs & imaging results that were available during my care of the patient were reviewed by me and considered in my medical decision making (see chart for details).       Patient presenting with atypical chest pain that started at rest  today and has persisted for the last 20 minutes.  Patient has had this pain intermittently for the last few months.  She states the pain today was more severe and she feels it in her back.  It is not related with eating or certain movements.  It is not pleuritic in nature.  Pulses are equal bilaterally.  Patient has multiple risk factors but no history of coronary artery disease.  EKG today without change.  Initial troponin is 0 but given she came 20 minutes after the pain will repeat.  CBC, BMP without acute findings.  D-dimer pending and patient was given 325 mg of aspirin.  3:17 PM D-dimer and delta trop wnl.  Pt feeling better.  Did state that she is unable to sleep lying down anymore because she will get reflux-like symptoms.  Suspicion that patient's pain is most likely GI in nature.  However she does have multiple risk factors.  However low suspicion for ACS today.  Patient's chest x-ray did show mild bibasilar atelectasis versus infiltrates but she is having no infectious symptoms concerning for pneumonia and feel it is most likely related to atelectasis.  Final Clinical Impressions(s) / ED Diagnoses   Final diagnoses:  Atypical chest pain    ED Discharge Orders    None       Blanchie Dessert, MD 06/21/18 1517

## 2018-06-21 NOTE — ED Triage Notes (Addendum)
Per pt, states CP for over a week-states she saw her PCP and referred her to a Cardiologist, has an appointment tomorrow-states increased pain this am-states no radiation just SOB-has not traveled out of the country or been around anyone who has been sick

## 2018-06-21 NOTE — Discharge Instructions (Signed)
Your chest pain does not appear to be from a heart attack today.  However it is important to follow-up with cardiology tomorrow.  Also with the pain starts again you can try Tums or Maalox to see if that helps.  If it becomes severe and you are having trouble breathing or you start vomiting and sweating please return.  Continue to take the Nexium and try to eat a bland diet with no acidic foods, spicy foods, fried foods for the next few weeks.

## 2018-06-22 ENCOUNTER — Other Ambulatory Visit: Payer: Self-pay

## 2018-06-22 ENCOUNTER — Encounter: Payer: Self-pay | Admitting: Cardiovascular Disease

## 2018-06-22 ENCOUNTER — Ambulatory Visit (INDEPENDENT_AMBULATORY_CARE_PROVIDER_SITE_OTHER): Payer: 59 | Admitting: Cardiovascular Disease

## 2018-06-22 VITALS — BP 132/86 | HR 71

## 2018-06-22 DIAGNOSIS — R0789 Other chest pain: Secondary | ICD-10-CM | POA: Diagnosis not present

## 2018-06-22 DIAGNOSIS — E785 Hyperlipidemia, unspecified: Secondary | ICD-10-CM | POA: Insufficient documentation

## 2018-06-22 MED ORDER — METOPROLOL TARTRATE 100 MG PO TABS: 100.0000 mg | ORAL_TABLET | Freq: Once | ORAL | 0 refills | Status: AC

## 2018-06-22 NOTE — Patient Instructions (Addendum)
Please arrive at the [facility] main entrance of [facility] at xx:xx AM (30-45 minutes prior to test start time)  [facility] [place], [place] [id number] [phone]  Proceed to the [facility] Radiology Department (First Floor).  Please follow these instructions carefully (unless otherwise directed):   On the Night Before the Test: . Be sure to Drink plenty of water. . Do not consume any caffeinated/decaffeinated beverages or chocolate 12 hours prior to your test. . Do not take any antihistamines 12 hours prior to your test. . If you take Metformin do not take 24 hours prior to test. . If the patient has contrast allergy: ? Patient will need a prescription for Prednisone and very clear instructions (as follows): 1. Prednisone 50 mg - take 13 hours prior to test 2. Take another Prednisone 50 mg 7 hours prior to test 3. Take another Prednisone 50 mg 1 hour prior to test 4. Take Benadryl 50 mg 1 hour prior to test . Patient must complete all four doses of above prophylactic medications. . Patient will need a ride after test due to Benadryl.  On the Day of the Test: . Drink plenty of water. Do not drink any water within one hour of the test. . Do not eat any food 4 hours prior to the test. . You may take your regular medications prior to the test.  . Take metoprolol (Lopressor) 100 mg two hours prior to test. . HOLD Furosemide/Hydrochlorothiazide morning of the test.      After the Test: . Drink plenty of water. . After receiving IV contrast, you may experience a mild flushed feeling. This is normal. . On occasion, you may experience a mild rash up to 24 hours after the test. This is not dangerous. If this occurs, you can take Benadryl 25 mg and increase your fluid intake. . If you experience trouble breathing, this can be serious. If it is severe call 911 IMMEDIATELY. If it is mild, please call our office. . If you take any of these  medications: Glipizide/Metformin, Avandament, Glucavance, please do not take 48 hours after completing test.  Lab work: Your physician recommends that you return for lab work 1-2 weeks prior to your coronary CTA. You do not need an appointment.  If you have labs (blood work) drawn today and your tests are completely normal, you will receive your results only by: Marland Kitchen MyChart Message (if you have MyChart) OR . A paper copy in the mail If you have any lab test that is abnormal or we need to change your treatment, we will call you to review the results.  Follow-Up: At [facility], you and your health needs are our priority.  As part of our continuing mission to provide you with exceptional heart care, we have created designated Provider Care Teams.  These Care Teams include your primary Cardiologist (physician) and Advanced Practice Providers (APPs -  Physician Assistants and Nurse Practitioners) who all work together to provide you with the care you need, when you need it. . You may schedule a follow up appointment AS NEEDED. You may see Dr. Gwenlyn Found or one of the following Advanced Practice Providers on your designated Care Team:   . Kerin Ransom, [place] . Almyra Deforest, PA-C . Fabian Sharp, PA-C . Jory Sims, DNP . Rosaria Ferries, PA-C . Roby Lofts, PA-C . Sande Rives, PA-C

## 2018-06-22 NOTE — Assessment & Plan Note (Signed)
History of hyperlipidemia with lipid profile performed 03/24/2018 revealing total cholesterol of 215, LDL 138 and HDL 52.  Subsequent to that she was begun on pravastatin by her PCP he was following this.

## 2018-06-22 NOTE — Assessment & Plan Note (Signed)
1 month history of atypical chest pain.  She has pins-and-needles type of pain lasting for seconds at a time and pressure that also last for seconds at a time.  She was seen in the emergency room on 06/21/2018 by Dr. Maryan Rued.  EKG showed no acute changes and her enzymes were negative.  I am going to get a coronary CTA to further evaluate

## 2018-06-22 NOTE — Progress Notes (Signed)
06/22/2018 Melanie Lane   [birth year]  [id number]  Primary Physician Lavone Orn, MD Primary Cardiologist: Lorretta Harp MD [name], [place]  HPI:  Melanie Lane is a 53 y.o. mildly overweight married African-American female mother of 4 children who works as a Nutritional therapist.  She was referred by [facility] ER for atypical chest pain.  Risk factors include treated hyperlipidemia and diabetes.  There is no family history.  She is never had a heart attack or stroke.  She has had some atypical chest pain for the last month characterized as pins-and-needles as well as pressure lasting for seconds at a time without associated symptoms.  She was seen in the ER last night by Dr. Maryan Rued.  Her EKG showed no acute changes and she ruled out for myocardial infarction.   Current Meds  Medication Sig  . acetaminophen (TYLENOL) 500 MG tablet Take 1 tablet (500 mg total) by mouth every 6 (six) hours as needed for mild pain or moderate pain.  . Insulin Glargine (BASAGLAR KWIKPEN) 100 UNIT/ML SOPN Inject 18 Units into the skin at bedtime.   Marland Kitchen omeprazole (PRILOSEC) 20 MG capsule Take 1 capsule (20 mg total) by mouth daily.  . pravastatin (PRAVACHOL) 40 MG tablet Take 40 mg by mouth at bedtime.     Allergies  Allergen Reactions  . Benadryl [Diphenhydramine Hcl] Hives  . Codeine Other (See Comments)    Excessive sleepiness    Social History   Socioeconomic History  . Marital status: Married    Spouse name: Not on file  . Number of children: Not on file  . Years of education: Not on file  . Highest education level: Not on file  Occupational History  . Not on file  Social Needs  . Financial resource strain: Not on file  . Food insecurity:    Worry: Not on file    Inability: Not on file  . Transportation needs:    Medical: Not on file    Non-medical: Not on file  Tobacco Use  . Smoking status: Never Smoker  . Smokeless tobacco: Never Used  Substance and Sexual Activity  . Alcohol  use: No  . Drug use: No  . Sexual activity: Not Currently  Lifestyle  . Physical activity:    Days per week: Not on file    Minutes per session: Not on file  . Stress: Not on file  Relationships  . Social connections:    Talks on phone: Not on file    Gets together: Not on file    Attends religious service: Not on file    Active member of club or organization: Not on file    Attends meetings of clubs or organizations: Not on file    Relationship status: Not on file  . Intimate partner violence:    Fear of current or ex partner: Not on file    Emotionally abused: Not on file    Physically abused: Not on file    Forced sexual activity: Not on file  Other Topics Concern  . Not on file  Social History Narrative  . Not on file     Review of Systems: General: negative for chills, fever, night sweats or weight changes.  Cardiovascular: negative for chest pain, dyspnea on exertion, edema, orthopnea, palpitations, paroxysmal nocturnal dyspnea or shortness of breath Dermatological: negative for rash Respiratory: negative for cough or wheezing Urologic: negative for hematuria Abdominal: negative for nausea, vomiting, diarrhea, bright red blood per  rectum, melena, or hematemesis Neurologic: negative for visual changes, syncope, or dizziness All other systems reviewed and are otherwise negative except as noted above.    Blood pressure 132/86, pulse 71, last menstrual period 06/24/2011.  General appearance: alert and no distress Neck: no adenopathy, no carotid bruit, no JVD, supple, symmetrical, trachea midline and thyroid not enlarged, symmetric, no tenderness/mass/nodules Lungs: clear to auscultation bilaterally Heart: regular rate and rhythm, S1, S2 normal, no murmur, click, rub or gallop Extremities: extremities normal, atraumatic, no cyanosis or edema Pulses: 2+ and symmetric Skin: Skin color, texture, turgor normal. No rashes or lesions Neurologic: Alert and oriented X 3,  normal strength and tone. Normal symmetric reflexes. Normal coordination and gait  EKG not performed today  ASSESSMENT AND PLAN:   Hyperlipidemia History of hyperlipidemia with lipid profile performed 03/24/2018 revealing total cholesterol of 215, LDL 138 and HDL 52.  Subsequent to that she was begun on pravastatin by her PCP he was following this.  Atypical chest pain 1 month history of atypical chest pain.  She has pins-and-needles type of pain lasting for seconds at a time and pressure that also last for seconds at a time.  She was seen in the emergency room on 06/21/2018 by Dr. Maryan Rued.  EKG showed no acute changes and her enzymes were negative.  I am going to get a coronary CTA to further evaluate      Lorretta Harp MD [facility], [facility] 06/22/2018 4:16 PM

## 2018-06-27 ENCOUNTER — Ambulatory Visit: Payer: 59

## 2018-07-28 ENCOUNTER — Ambulatory Visit: Payer: 59

## 2018-07-29 DIAGNOSIS — E119 Type 2 diabetes mellitus without complications: Secondary | ICD-10-CM | POA: Diagnosis not present

## 2018-07-29 DIAGNOSIS — E1169 Type 2 diabetes mellitus with other specified complication: Secondary | ICD-10-CM | POA: Diagnosis not present

## 2018-07-29 DIAGNOSIS — Z794 Long term (current) use of insulin: Secondary | ICD-10-CM | POA: Diagnosis not present

## 2018-08-02 DIAGNOSIS — E1169 Type 2 diabetes mellitus with other specified complication: Secondary | ICD-10-CM | POA: Diagnosis not present

## 2018-08-20 DIAGNOSIS — Z79899 Other long term (current) drug therapy: Secondary | ICD-10-CM | POA: Diagnosis not present

## 2018-08-20 DIAGNOSIS — J029 Acute pharyngitis, unspecified: Secondary | ICD-10-CM | POA: Diagnosis not present

## 2018-08-20 DIAGNOSIS — K219 Gastro-esophageal reflux disease without esophagitis: Secondary | ICD-10-CM | POA: Diagnosis not present

## 2018-08-20 DIAGNOSIS — R05 Cough: Secondary | ICD-10-CM | POA: Diagnosis not present

## 2018-08-23 DIAGNOSIS — J029 Acute pharyngitis, unspecified: Secondary | ICD-10-CM | POA: Diagnosis not present

## 2018-08-23 DIAGNOSIS — J069 Acute upper respiratory infection, unspecified: Secondary | ICD-10-CM | POA: Diagnosis not present

## 2018-08-30 DIAGNOSIS — J0141 Acute recurrent pansinusitis: Secondary | ICD-10-CM | POA: Diagnosis not present

## 2018-08-30 DIAGNOSIS — J302 Other seasonal allergic rhinitis: Secondary | ICD-10-CM | POA: Diagnosis not present

## 2018-08-30 DIAGNOSIS — K219 Gastro-esophageal reflux disease without esophagitis: Secondary | ICD-10-CM | POA: Diagnosis not present

## 2018-09-30 ENCOUNTER — Other Ambulatory Visit: Payer: Self-pay

## 2018-09-30 ENCOUNTER — Ambulatory Visit
Admission: RE | Admit: 2018-09-30 | Discharge: 2018-09-30 | Disposition: A | Discharge status: Home / Self Care | Payer: 59 | Source: Ambulatory Visit | Attending: Internal Medicine | Admitting: Internal Medicine

## 2018-09-30 ENCOUNTER — Ambulatory Visit: Payer: 59

## 2018-09-30 DIAGNOSIS — Z1231 Encounter for screening mammogram for malignant neoplasm of breast: Secondary | ICD-10-CM

## 2020-03-25 IMAGING — CR CHEST - 2 VIEW
2 series · 2 of 2 positions shown · non-contrast
Comparison: 05/20/2018.

CLINICAL DATA: Chest pain.

EXAM:
CHEST - 2 VIEW

[w chest pa]
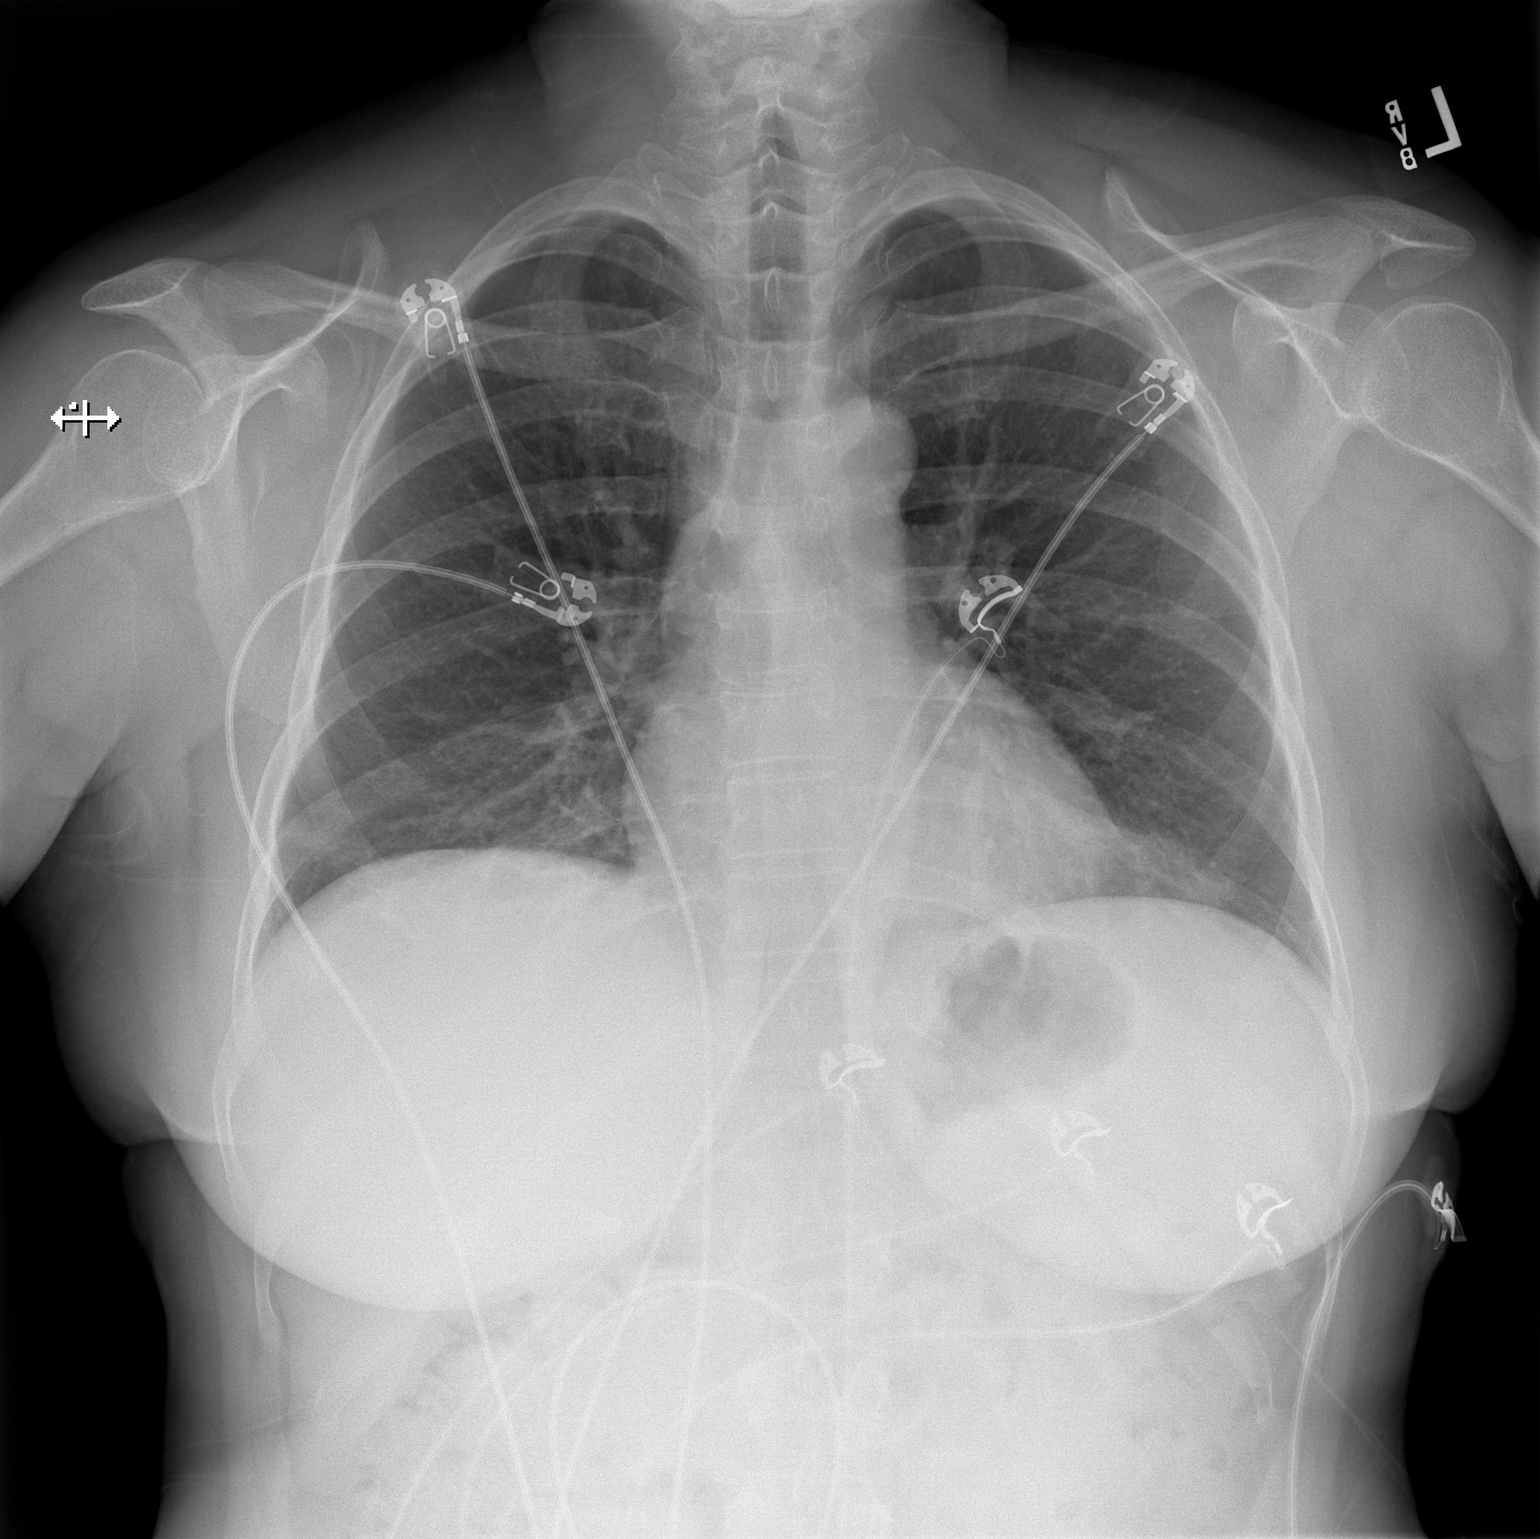

[w chest lat]
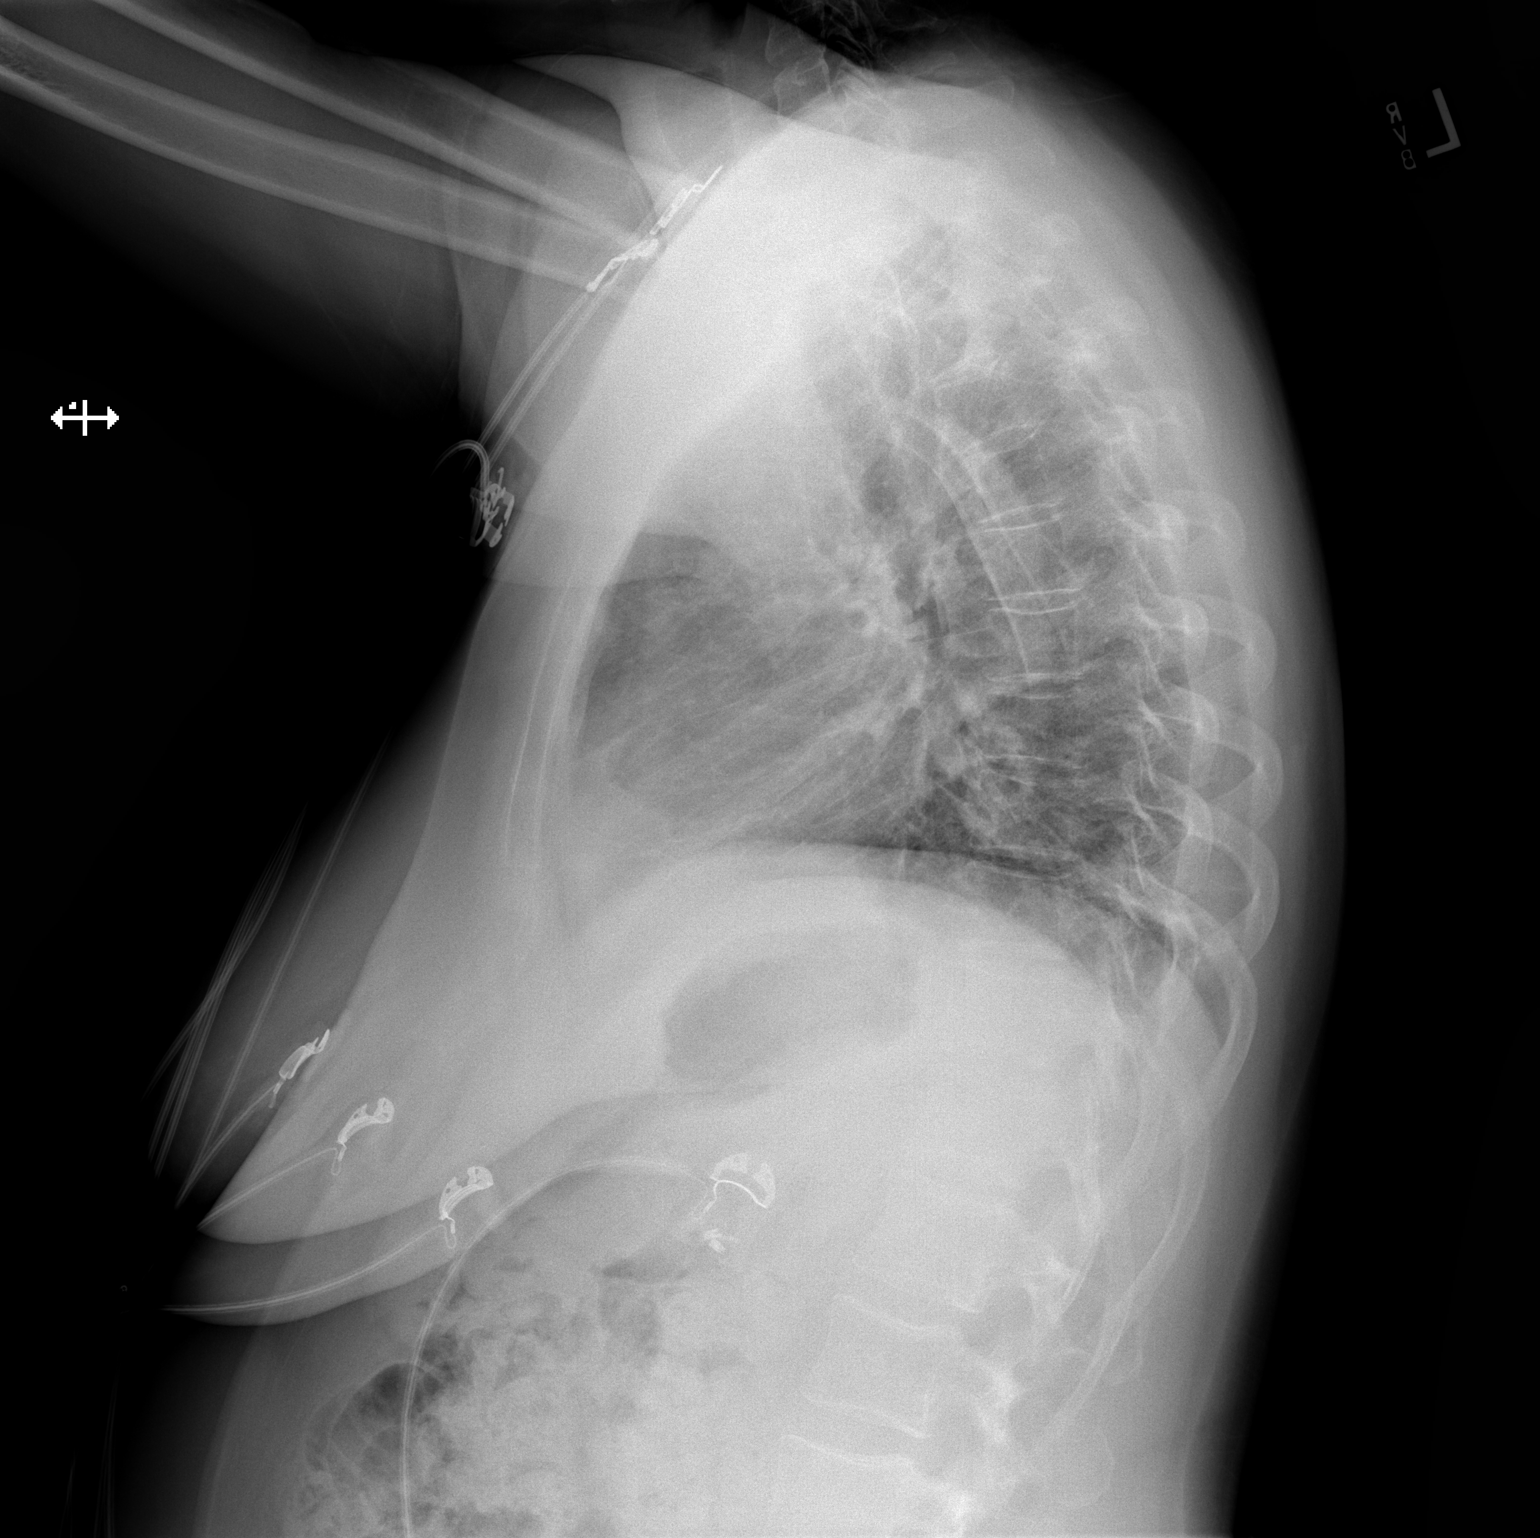

[2 of 2 positions shown; findings below may reference images not displayed]

FINDINGS: Mediastinum and hilar structures normal. Mild bibasilar
atelectasis/infiltrates. No pleural effusion or pneumothorax.
Degenerative change thoracic spine.
IMPRESSION: Low lung volumes.  Mild bibasilar atelectasis/infiltrates.

## 2020-12-10 ENCOUNTER — Ambulatory Visit: Payer: 59 | Admitting: Podiatrist

## 2021-04-17 ENCOUNTER — Other Ambulatory Visit: Payer: Self-pay | Admitting: Internal Medicine

## 2021-04-17 DIAGNOSIS — Z1231 Encounter for screening mammogram for malignant neoplasm of breast: Secondary | ICD-10-CM

## 2021-04-18 ENCOUNTER — Ambulatory Visit: Payer: 59

## 2021-06-17 ENCOUNTER — Ambulatory Visit
Admission: RE | Admit: 2021-06-17 | Discharge: 2021-06-17 | Disposition: A | Discharge status: Home / Self Care | Payer: 59 | Source: Ambulatory Visit | Attending: Internal Medicine | Admitting: Internal Medicine

## 2021-06-17 DIAGNOSIS — Z1231 Encounter for screening mammogram for malignant neoplasm of breast: Secondary | ICD-10-CM

## 2021-10-28 ENCOUNTER — Encounter (HOSPITAL_COMMUNITY): Payer: Self-pay

## 2021-10-28 ENCOUNTER — Ambulatory Visit (HOSPITAL_COMMUNITY)
Admission: EM | Admit: 2021-10-28 | Discharge: 2021-10-28 | Disposition: A | Discharge status: Home / Self Care | Payer: 59 | Attending: Urgent Care | Admitting: Urgent Care

## 2021-10-28 DIAGNOSIS — U071 COVID-19: Secondary | ICD-10-CM | POA: Diagnosis not present

## 2021-10-28 MED ORDER — NIRMATRELVIR/RITONAVIR (PAXLOVID)TABLET: 3.0000 | ORAL_TABLET | Freq: Two times a day (BID) | ORAL | 0 refills | Status: DC

## 2021-10-28 MED ORDER — MOLNUPIRAVIR EUA 200MG CAPSULE: 4.0000 | ORAL_CAPSULE | Freq: Two times a day (BID) | ORAL | 0 refills | Status: AC

## 2021-10-28 NOTE — ED Triage Notes (Signed)
Pt c/o cough, congestion, sore throat, headache, and fever since Sunday. States had a positive home covid test. Took OTC meds with relief.

## 2021-10-28 NOTE — ED Provider Notes (Signed)
[facility]    CSN: [id number] Arrival date & time: 10/28/21  1039      History   Chief Complaint Chief Complaint  Patient presents with   Covid Positive   Cough    HPI Melanie Lane is a 56 y.o. female.   Pleasant 56 year old female with a known history of hypertension, diabetes, high cholesterol presents today after having a positive home COVID test yesterday.  She states her symptoms started out on Sunday with a scratchy throat, fever, headache, body aches, and diarrhea.  She states symptoms were much worse yesterday, Monday.  She has been taking over-the-counter Alka-Seltzer plus.  She feels slightly better today than yesterday, but is still having loose stools, fever, headaches.  She denies abdominal pain.  She reports some discomfort to her sternum.  Highest temperature was 103.3.  She denies significant shortness of breath or nuchal rigidity.  She has not had COVID to date, is fully vaccinated.  Husband also with similar symptoms.   Cough   Past Medical History:  Diagnosis Date   Diabetes mellitus    type 2-recent diagnosis-exercise and diet controlled   Headache(784.0)    Hyperlipidemia    Joint swelling     Patient Active Problem List   Diagnosis Date Noted   Hyperlipidemia 06/22/2018   Atypical chest pain 06/22/2018   Syncope 01/27/2016    Past Surgical History:  Procedure Laterality Date   ABDOMINAL HYSTERECTOMY  07/16/2011   Procedure: HYSTERECTOMY ABDOMINAL;  Surgeon: Maeola Sarah. Landry Mellow, MD;  Location: [facility];  Service: Gynecology;  Laterality: N/A;   APPENDECTOMY     CHOLECYSTECTOMY     DILATION AND CURETTAGE OF UTERUS     MOUTH SURGERY      OB History   No obstetric history on file.      Home Medications    Prior to Admission medications   Medication Sig Start Date End Date Taking? Authorizing Provider  molnupiravir EUA (LAGEVRIO) 200 mg CAPS capsule Take 4 capsules (800 mg total) by mouth 2 (two) times daily for 5 days. 10/28/21  11/02/21 Yes Patriciaann Rabanal L, PA  acetaminophen (TYLENOL) 500 MG tablet Take 1 tablet (500 mg total) by mouth every 6 (six) hours as needed for mild pain or moderate pain. 08/03/16   Waynetta Pean, PA-C  Insulin Glargine (BASAGLAR KWIKPEN) 100 UNIT/ML SOPN Inject 18 Units into the skin at bedtime.  04/29/18   [provider]  metoprolol tartrate (LOPRESSOR) 100 MG tablet Take 1 tablet (100 mg total) by mouth once for 1 dose. Take 2 hours prior to your coronary CTA 06/22/18 06/22/18  Lorretta Harp, MD  omeprazole (PRILOSEC) 20 MG capsule Take 1 capsule (20 mg total) by mouth daily. 08/03/16   Waynetta Pean, PA-C  pravastatin (PRAVACHOL) 40 MG tablet Take 40 mg by mouth at bedtime. 06/16/18   [provider]    Family History Family History  Problem Relation Age of Onset   Breast cancer Neg Hx     Social History Social History   Tobacco Use   Smoking status: Never   Smokeless tobacco: Never  Substance Use Topics   Alcohol use: No   Drug use: No     Allergies   Benadryl [diphenhydramine hcl] and Codeine   Review of Systems Review of Systems  Respiratory:  Positive for cough.   As per HPI   Physical Exam Triage Vital Signs ED Triage Vitals [10/28/21 1222]  Enc Vitals Group     BP  133/89     Pulse Rate (!) 101     Resp 18     Temp 100 F (37.8 C)     Temp Source Oral     SpO2 95 %     Weight      Height      Head Circumference      Peak Flow      Pain Score 8     Pain Loc      Pain Edu?      Excl. in [place]?    No data found.  Updated Vital Signs BP 133/89 (BP Location: Left Arm)   Pulse (!) 101   Temp 100 F (37.8 C) (Oral)   Resp 18   LMP 06/24/2011   SpO2 95%   Visual Acuity Right Eye Distance:   Left Eye Distance:   Bilateral Distance:    Right Eye Near:   Left Eye Near:    Bilateral Near:     Physical Exam Vitals and nursing note reviewed.  Constitutional:      General: She is not in acute distress.    Appearance: Normal  appearance. She is well-developed. She is obese. She is ill-appearing. She is not toxic-appearing or diaphoretic.  HENT:     Head: Normocephalic and atraumatic.     Right Ear: External ear normal.     Left Ear: External ear normal.     Nose:     Comments: Mask in place Eyes:     General: No scleral icterus.    Extraocular Movements: Extraocular movements intact.     Conjunctiva/sclera: Conjunctivae normal.     Pupils: Pupils are equal, round, and reactive to light.  Cardiovascular:     Rate and Rhythm: Regular rhythm. Tachycardia present.     Pulses: Normal pulses.     Heart sounds: No murmur heard. Pulmonary:     Effort: Pulmonary effort is normal. No respiratory distress.     Breath sounds: Normal breath sounds. No stridor. No wheezing, rhonchi or rales.  Chest:     Chest wall: Tenderness (mild reproducible tenderness to anterior sternum) present.  Abdominal:     General: There is no distension.     Palpations: Abdomen is soft.     Tenderness: There is no abdominal tenderness.  Musculoskeletal:        General: No swelling.     Cervical back: Normal range of motion and neck supple. No rigidity or tenderness.  Lymphadenopathy:     Cervical: No cervical adenopathy.  Skin:    General: Skin is warm and dry.     Capillary Refill: Capillary refill takes less than 2 seconds.     Coloration: Skin is jaundiced.     Findings: No erythema or rash.  Neurological:     General: No focal deficit present.     Mental Status: She is alert and oriented to person, place, and time.  Psychiatric:        Mood and Affect: Mood normal.      UC Treatments / Results  Labs (all labs ordered are listed, but only abnormal results are displayed) Labs Reviewed - No data to display  EKG   Radiology No results found.  Procedures Procedures (including critical care time)  Medications Ordered in UC Medications - No data to display  Initial Impression / Assessment and Plan / UC Course  I  have reviewed the triage vital signs and the nursing notes.  Pertinent labs & imaging results that were  available during my care of the patient were reviewed by me and considered in my medical decision making (see chart for details).     Covid-19 -patient with significant medical risk factors presents on the third day of symptoms related to COVID-19.  Temp 100 in office.  Oxygen 95%.  Symptom improvement not noted, therefore would recommend antiviral therapy.  Paxlovid not indicated secondary to patient's home medications.  This was initially called in, but pharmacy was contacted and canceled Rx.  Molnupiravir called in for patient to start today.  Instructional handout given to patient, recommended she read in full.  Discussed this is EUA, not FDA approved.  Side effect profile of medications also discussed.  Patient to follow-up with PCP, head to ER if any new or worsening symptoms occurs.   Final Clinical Impressions(s) / UC Diagnoses   Final diagnoses:  [id number]     Discharge Instructions      You have been prescribed an antiviral therapy.  Take 4 capsules twice daily for 5 days.  This is not FDA approved, it is under the emergency use authorization only.  Please read the attached handouts.  This must be started before the fifth day of symptoms. Quarantine for a total of 5 days, wear an N95 mask for at least 5 days thereafter. Monitor for any worsening symptoms including shortness of breath, uncontrolled fever, chest pain which may warrant repeat evaluation.    ED Prescriptions     Medication Sig Dispense Auth. Provider   nirmatrelvir/ritonavir EUA (PAXLOVID) 20 x 150 MG & 10 x '100MG'$  TABS  (Status: Discontinued) Take 3 tablets by mouth 2 (two) times daily for 5 days. Patient GFR is 60. Take nirmatrelvir (150 mg) two tablets twice daily for 5 days and ritonavir (100 mg) one tablet twice daily for 5 days. 30 tablet Marigny Borre L, PA   molnupiravir EUA (LAGEVRIO) 200 mg CAPS capsule  Take 4 capsules (800 mg total) by mouth 2 (two) times daily for 5 days. 40 capsule Zakariah Urwin L, PA      PDMP not reviewed this encounter.   Chaney Malling, [place] 10/28/21 1308

## 2021-10-28 NOTE — Discharge Instructions (Addendum)
You have been prescribed an antiviral therapy.  Take 4 capsules twice daily for 5 days.  This is not FDA approved, it is under the emergency use authorization only.  Please read the attached handouts.  This must be started before the fifth day of symptoms. Quarantine for a total of 5 days, wear an N95 mask for at least 5 days thereafter. Monitor for any worsening symptoms including shortness of breath, uncontrolled fever, chest pain which may warrant repeat evaluation.

## 2022-07-10 ENCOUNTER — Other Ambulatory Visit: Payer: Self-pay | Admitting: Internal Medicine

## 2022-07-10 ENCOUNTER — Ambulatory Visit
Admission: RE | Admit: 2022-07-10 | Discharge: 2022-07-10 | Disposition: A | Discharge status: Home / Self Care | Source: Ambulatory Visit | Attending: Internal Medicine | Admitting: Internal Medicine

## 2022-07-10 DIAGNOSIS — G8929 Other chronic pain: Secondary | ICD-10-CM

## 2022-07-10 MED ORDER — IOPAMIDOL (ISOVUE-370) INJECTION 76%
80.0000 mL | Freq: Once | INTRAVENOUS | Status: AC | PRN
  Administered 2022-07-10: 80 mL via INTRAVENOUS

## 2022-07-18 LAB — AMB RESULTS CONSOLE CBG: Glucose: 271

## 2022-07-18 NOTE — Progress Notes (Signed)
Patient did not have any SDOH needs.

## 2022-07-29 ENCOUNTER — Encounter: Payer: Self-pay | Admitting: *Deleted

## 2022-07-29 NOTE — Progress Notes (Signed)
Pt attended 07/18/22 screening where b/p was 105/74 and blood sugar ws 271. At event, pt did not identify any SDOH insecurities. Chart review indicates that pt sees Dr. Hillard Danker at [facility] as her PCP and had her most recent visit on 07/20/2022 and PCP notes confirm pt has diabetes is taking meds for her DM, and is monitoring her blood sugars. No additional health equity team support inidicated at this time.

## 2023-06-01 ENCOUNTER — Other Ambulatory Visit: Payer: Self-pay | Admitting: Internal Medicine

## 2023-06-01 DIAGNOSIS — Z0000 Encounter for general adult medical examination without abnormal findings: Secondary | ICD-10-CM

## 2023-06-08 ENCOUNTER — Ambulatory Visit

## 2023-06-15 ENCOUNTER — Ambulatory Visit
Admission: RE | Admit: 2023-06-15 | Discharge: 2023-06-15 | Disposition: A | Discharge status: Home / Self Care | Source: Ambulatory Visit | Attending: Internal Medicine | Admitting: Internal Medicine

## 2023-06-15 DIAGNOSIS — Z0000 Encounter for general adult medical examination without abnormal findings: Secondary | ICD-10-CM
